# Patient Record
Sex: Female | Born: 1985 | Race: White | Hispanic: No | Marital: Single
Health system: Southern US, Community
[De-identification: ages and names within clinical notes are randomized; demographics above are authoritative.]

## PROBLEM LIST (undated history)

## (undated) DIAGNOSIS — F419 Anxiety disorder, unspecified: Secondary | ICD-10-CM

## (undated) DIAGNOSIS — B001 Herpesviral vesicular dermatitis: Secondary | ICD-10-CM

---

## 2020-01-25 ENCOUNTER — Other Ambulatory Visit: Payer: Self-pay | Admitting: Nurse Practitioner

## 2020-01-25 DIAGNOSIS — U071 COVID-19: Secondary | ICD-10-CM

## 2020-01-25 NOTE — Progress Notes (Signed)
I connected by phone with Yesenia Long on 01/25/2020 at 7:16 PM to discuss the potential use of a new treatment for mild to moderate COVID-19 viral infection in non-hospitalized patients.  This patient is a 34 y.o. female that meets the FDA criteria for Emergency Use Authorization of COVID monoclonal antibody casirivimab/imdevimab or bamlanivimab/eteseviamb.  Has a (+) direct SARS-CoV-2 viral test result  Has mild or moderate COVID-19   Is NOT hospitalized due to COVID-19  Is within 10 days of symptom onset  Has at least one of the high risk factor(s) for progression to severe COVID-19 and/or hospitalization as defined in EUA.  Specific high risk criteria : BMI > 25 and Other high risk medical condition per CDC:  smoker   I have spoken and communicated the following to the patient or parent/caregiver regarding COVID monoclonal antibody treatment:  1. FDA has authorized the emergency use for the treatment of mild to moderate COVID-19 in adults and pediatric patients with positive results of direct SARS-CoV-2 viral testing who are 29 years of age and older weighing at least 40 kg, and who are at high risk for progressing to severe COVID-19 and/or hospitalization.  2. The significant known and potential risks and benefits of COVID monoclonal antibody, and the extent to which such potential risks and benefits are unknown.  3. Information on available alternative treatments and the risks and benefits of those alternatives, including clinical trials.  4. Patients treated with COVID monoclonal antibody should continue to self-isolate and use infection control measures (e.g., wear mask, isolate, social distance, avoid sharing personal items, clean and disinfect "high touch" surfaces, and frequent handwashing) according to CDC guidelines.   5. The patient or parent/caregiver has the option to accept or refuse COVID monoclonal antibody treatment.  After reviewing this information with the  patient, the patient has agreed to receive one of the available covid 19 monoclonal antibodies and will be provided an appropriate fact sheet prior to infusion. [name], NP 01/25/2020 7:16 PM

## 2020-01-26 ENCOUNTER — Ambulatory Visit (HOSPITAL_COMMUNITY)
Admission: RE | Admit: 2020-01-26 | Discharge: 2020-01-26 | Disposition: A | Discharge status: Home / Self Care | Payer: 59 | Source: Ambulatory Visit | Attending: Pulmonary Disease | Admitting: Pulmonary Disease

## 2020-01-26 DIAGNOSIS — U071 COVID-19: Secondary | ICD-10-CM | POA: Insufficient documentation

## 2020-01-26 MED ORDER — FAMOTIDINE IN NACL 20-0.9 MG/50ML-% IV SOLN: 20.0000 mg | Freq: Once | INTRAVENOUS | Status: DC | PRN

## 2020-01-26 MED ORDER — SODIUM CHLORIDE 0.9 % IV SOLN: INTRAVENOUS | Status: DC | PRN

## 2020-01-26 MED ORDER — EPINEPHRINE 0.3 MG/0.3ML IJ SOAJ: 0.3000 mg | Freq: Once | INTRAMUSCULAR | Status: DC | PRN

## 2020-01-26 MED ORDER — SODIUM CHLORIDE 0.9 % IV SOLN: Freq: Once | INTRAVENOUS | Status: AC

## 2020-01-26 MED ORDER — METHYLPREDNISOLONE SODIUM SUCC 125 MG IJ SOLR: 125.0000 mg | Freq: Once | INTRAMUSCULAR | Status: DC | PRN

## 2020-01-26 MED ORDER — DIPHENHYDRAMINE HCL 50 MG/ML IJ SOLN: 50.0000 mg | Freq: Once | INTRAMUSCULAR | Status: DC | PRN

## 2020-01-26 MED ORDER — ALBUTEROL SULFATE HFA 108 (90 BASE) MCG/ACT IN AERS: 2.0000 | INHALATION_SPRAY | Freq: Once | RESPIRATORY_TRACT | Status: DC | PRN

## 2020-01-26 NOTE — Discharge Instructions (Signed)

## 2020-01-26 NOTE — Progress Notes (Signed)
  Diagnosis: COVID-19  Physician: Dr. Wright  Procedure: [facility] Med: bamlanivimab\etesevimab infusion - Provided patient with bamlanimivab\etesevimab fact sheet for patients, parents and caregivers prior to infusion.  Complications: No immediate complications noted.  Discharge: Discharged home   Larcenia Holaday J Xayla Puzio 01/26/2020  

## 2020-02-03 ENCOUNTER — Other Ambulatory Visit (HOSPITAL_COMMUNITY): Payer: Self-pay

## 2020-12-25 ENCOUNTER — Ambulatory Visit
Admission: RE | Admit: 2020-12-25 | Discharge: 2020-12-25 | Disposition: A | Discharge status: Home / Self Care | Payer: 59 | Source: Ambulatory Visit | Attending: Physician Assistant | Admitting: Physician Assistant

## 2020-12-25 ENCOUNTER — Other Ambulatory Visit: Payer: Self-pay | Admitting: Physician Assistant

## 2020-12-25 DIAGNOSIS — M5442 Lumbago with sciatica, left side: Secondary | ICD-10-CM

## 2020-12-25 DIAGNOSIS — G8929 Other chronic pain: Secondary | ICD-10-CM

## 2020-12-25 DIAGNOSIS — R202 Paresthesia of skin: Secondary | ICD-10-CM

## 2021-10-05 ENCOUNTER — Other Ambulatory Visit: Payer: Self-pay | Admitting: Internal Medicine

## 2021-10-05 ENCOUNTER — Ambulatory Visit
Admission: RE | Admit: 2021-10-05 | Discharge: 2021-10-05 | Disposition: A | Discharge status: Home / Self Care | Payer: 59 | Source: Ambulatory Visit | Attending: Internal Medicine | Admitting: Internal Medicine

## 2021-10-05 DIAGNOSIS — M25561 Pain in right knee: Secondary | ICD-10-CM

## 2021-10-17 ENCOUNTER — Encounter: Payer: Self-pay | Admitting: Orthopedic Surgery

## 2021-10-17 ENCOUNTER — Ambulatory Visit: Payer: 59 | Admitting: Surgical

## 2021-10-17 DIAGNOSIS — M25561 Pain in right knee: Secondary | ICD-10-CM

## 2021-10-17 DIAGNOSIS — G8929 Other chronic pain: Secondary | ICD-10-CM

## 2021-10-17 MED ORDER — LIDOCAINE HCL 1 % IJ SOLN
5.0000 mL | INTRAMUSCULAR | Status: AC | PRN
  Administered 2021-10-17: 5 mL

## 2021-10-17 MED ORDER — BUPIVACAINE HCL 0.25 % IJ SOLN
4.0000 mL | INTRAMUSCULAR | Status: AC | PRN
  Administered 2021-10-17: 4 mL via INTRA_ARTICULAR

## 2021-10-17 MED ORDER — METHYLPREDNISOLONE ACETATE 40 MG/ML IJ SUSP
40.0000 mg | INTRAMUSCULAR | Status: AC | PRN
  Administered 2021-10-17: 40 mg via INTRA_ARTICULAR
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
## 2021-10-18 LAB — VITAMIN D 25 HYDROXY (VIT D DEFICIENCY, FRACTURES): Vit D, 25-Hydroxy: 53 ng/mL (ref 30–100)

## 2021-11-14 ENCOUNTER — Ambulatory Visit (INDEPENDENT_AMBULATORY_CARE_PROVIDER_SITE_OTHER): Payer: 59 | Admitting: Surgical

## 2021-11-14 DIAGNOSIS — G8929 Other chronic pain: Secondary | ICD-10-CM

## 2021-11-14 DIAGNOSIS — M25561 Pain in right knee: Secondary | ICD-10-CM

## 2021-11-18 ENCOUNTER — Encounter: Payer: Self-pay | Admitting: Orthopedic Surgery

## 2021-11-18 NOTE — Progress Notes (Signed)
Office Visit Note   Patient: Yesenia Long           Date of Birth: April 07, 1986           MRN: [id number] Visit Date: 11/14/2021 Requested by: Collene Mares, PA [address] [place] [place],  [place] [address] PCP: Hyacinth Meeker, [place], [place]  Subjective: Chief Complaint  Patient presents with   Right Knee - Follow-up    HPI: Yesenia Long is a 36 y.o. female who presents to the office for follow-up following right knee injection on 10/17/2021.  She got about 60% relief from the injection.  She has stopped running and switch to elliptical.  She still notes increased pain with her leg hanging or crossing her legs.  Pain level depends on how active she is.  Pain is not waking her up at night.  She does have history of RA but does not have any rheumatologist right now she is working to get that set up.              ROS: All systems reviewed are negative as they relate to the chief complaint within the history of present illness.  Patient denies fevers or chills.  Assessment & Plan: Visit Diagnoses:  1. Chronic pain of right knee   2. Lateral joint line tenderness of knee, right     Plan:   Last visit (10/17/21): Patient is a 36 year old female who presents for evaluation of right knee pain.  She has increased pain over the last 5 weeks without any injury event that she can recall.  She has reduced her weight lifting and running without improvement of her symptoms.  Radiographs were ordered by her PCP that showed no significant degenerative changes or any acute fracture, dislocation, chondral defect that is apparent.  With lack of effusion today and her history of rheumatoid arthritis that may be contributing to her joint pain, plan to try cortisone injection today.  Also recommended she stop running and transition to stationary bike or elliptical workouts instead for cardio.  Checking her vitamin D today to see if this is low and needs to be supplemented as she may have some  contribution from a stress reaction in this right tibia.  Follow-up in 6 weeks for clinical recheck.  If no improvement at that time or if her symptoms continue get worse with no improvement from the injection, we will plan to set her up for MRI of the right knee for further evaluation.  Patient agreed with plan.  Tolerated injection well.  Today's visit:  She had 60% improvement from cortisone injection initially but this is wearing off.  Symptoms are still present despite her switching from running to elliptical.  She had vitamin D checked at last visit which was found to be 53.  No effusion today but continued joint line tenderness primarily over the lateral joint line.  With continued symptoms and lack of full relief from cortisone injection as well as history of RA, plan to order MRI of the right knee for further evaluation of potential meniscal injury.  Follow-up after MRI to review results with Dr. August Saucer.  Follow-Up Instructions: No follow-ups on file.   Orders:  Orders Placed This Encounter  Procedures   MR Knee Right w/o contrast   No orders of the defined types were placed in this encounter.     Procedures: No procedures performed   Clinical Data: No additional findings.  Objective: Vital Signs: There were no vitals taken  for this visit.  Physical Exam:  Constitutional: Patient appears well-developed HEENT:  Head: Normocephalic Eyes:EOM are normal Neck: Normal range of motion Cardiovascular: Normal rate Pulmonary/chest: Effort normal Neurologic: Patient is alert Skin: Skin is warm Psychiatric: Patient has normal mood and affect  Ortho Exam: Ortho exam demonstrates right knee with no effusion.  Continued lateral joint line tenderness moderately and mild medial joint line tenderness.  She is able to perform straight leg raise.  Excellent range of motion with 0 degrees extension and greater than 120 degrees knee flexion.  No reproduction of knee pain with hip range of  motion.  Stable to anterior posterior drawer sign.  No laxity to varus/valgus stress.  No tenderness over the pes anserine bursa, quad tendon, patellar tendon, patella.  Specialty Comments:  No specialty comments available.  Imaging: No results found.   PMFS History: There are no problems to display for this patient.  No past medical history on file.  No family history on file.  No past surgical history on file. Social History   Occupational History   Not on file  Tobacco Use   Smoking status: Not on file   Smokeless tobacco: Not on file  Substance and Sexual Activity   Alcohol use: Not on file   Drug use: Not on file   Sexual activity: Not on file

## 2021-11-25 ENCOUNTER — Other Ambulatory Visit: Payer: 59

## 2021-12-05 ENCOUNTER — Encounter: Payer: Self-pay | Admitting: Orthopedic Surgery

## 2021-12-05 ENCOUNTER — Ambulatory Visit (INDEPENDENT_AMBULATORY_CARE_PROVIDER_SITE_OTHER): Payer: 59 | Admitting: Orthopedic Surgery

## 2021-12-05 DIAGNOSIS — M25561 Pain in right knee: Secondary | ICD-10-CM | POA: Diagnosis not present

## 2021-12-05 DIAGNOSIS — G8929 Other chronic pain: Secondary | ICD-10-CM | POA: Diagnosis not present

## 2021-12-05 NOTE — Progress Notes (Signed)
Office Visit Note   Patient: Yesenia Long           Date of Birth: 05/03/1985           MRN: [id number] Visit Date: 12/05/2021 Requested by: Collene Mares, PA [address] [place],  [place] [address] PCP: Hyacinth Meeker, [place], [place]  Subjective: Chief Complaint  Patient presents with   Right Knee - Follow-up    HPI: Patient presents for follow-up of right knee pain.  Patient describes symptoms in the right knee of 8 months duration.  She has had to stop running.  Symptoms have been getting progressively worse.  Denies any other joint complaints.  Does have history of rheumatoid arthritis for which she receives hip injections but she denies any groin pain currently on either side.  Denies much in the way of back pain or radicular component.  She has had an injection in the right knee which helped.  She has been doing physician guided therapy at the gym 4 to 5 days a week consisting of leg extension elliptical and stationary bike.  She has been using a compression sleeve as well.  Also using Naprosyn and ice.  Vitamin D level returned normal at 53.              ROS: All systems reviewed are negative as they relate to the chief complaint within the history of present illness.  Patient denies  fevers or chills.   Assessment & Plan: Visit Diagnoses:  1. Chronic pain of right knee     Plan: Impression is anterolateral and retropatellar right knee pain of 8 months duration with failure of conservative treatment including medication injections as well as physician directed therapy program with quad strengthening.  Her quad strength and flexibility is excellent at this time but the pain remains.  She has failed an exhaustive course of nonoperative treatment.  Differential diagnosis includes anterior horn lateral meniscal pathology versus stress reaction versus occult arthritis.  No effusion in the knee today.  Plan MRI scan to evaluate with follow-up after that study.  Follow-Up  Instructions: Return for after MRI.   Orders:  No orders of the defined types were placed in this encounter.  No orders of the defined types were placed in this encounter.     Procedures: No procedures performed   Clinical Data: No additional findings.  Objective: Vital Signs: There were no vitals taken for this visit.  Physical Exam:   Constitutional: Patient appears well-developed HEENT:  Head: Normocephalic Eyes:EOM are normal Neck: Normal range of motion Cardiovascular: Normal rate Pulmonary/chest: Effort normal Neurologic: Patient is alert Skin: Skin is warm Psychiatric: Patient has normal mood and affect   Ortho Exam: Ortho exam demonstrates full active and passive range of motion of the right knee.  Quad strength is excellent.  Flexibility is excellent with quad and hamstring flexibility.  Does have retropatellar tenderness and Periretinacular tenderness on the right not the left.  No effusion is present.  Anterolateral joint line tenderness is present on the right not on the left.  No warmth in either knee.  Specialty Comments:  No specialty comments available.  Imaging: No results found.   PMFS History: There are no problems to display for this patient.  History reviewed. No pertinent past medical history.  History reviewed. No pertinent family history.  History reviewed. No pertinent surgical history. Social History   Occupational History   Not on file  Tobacco Use   Smoking status:  Not on file   Smokeless tobacco: Not on file  Substance and Sexual Activity   Alcohol use: Not on file   Drug use: Not on file   Sexual activity: Not on file

## 2021-12-05 NOTE — Addendum Note (Signed)
Addended byPrescott Parma on: 12/05/2021 01:43 PM   Modules accepted: Orders

## 2021-12-23 ENCOUNTER — Ambulatory Visit
Admission: RE | Admit: 2021-12-23 | Discharge: 2021-12-23 | Disposition: A | Discharge status: Home / Self Care | Payer: 59 | Source: Ambulatory Visit | Attending: Orthopedic Surgery | Admitting: Orthopedic Surgery

## 2021-12-23 DIAGNOSIS — G8929 Other chronic pain: Secondary | ICD-10-CM

## 2021-12-26 ENCOUNTER — Ambulatory Visit: Payer: 59 | Admitting: Orthopedic Surgery

## 2021-12-26 ENCOUNTER — Ambulatory Visit: Payer: Self-pay

## 2021-12-26 DIAGNOSIS — M7631 Iliotibial band syndrome, right leg: Secondary | ICD-10-CM | POA: Diagnosis not present

## 2021-12-26 DIAGNOSIS — M7632 Iliotibial band syndrome, left leg: Secondary | ICD-10-CM | POA: Diagnosis not present

## 2021-12-26 DIAGNOSIS — M25561 Pain in right knee: Secondary | ICD-10-CM | POA: Diagnosis not present

## 2021-12-29 ENCOUNTER — Encounter: Payer: Self-pay | Admitting: Orthopedic Surgery

## 2021-12-29 MED ORDER — METHYLPREDNISOLONE ACETATE 40 MG/ML IJ SUSP
20.0000 mg | INTRAMUSCULAR | Status: AC | PRN
  Administered 2021-12-26: 20 mg via INTRA_ARTICULAR

## 2021-12-29 MED ORDER — BUPIVACAINE HCL 0.25 % IJ SOLN
2.0000 mL | INTRAMUSCULAR | Status: AC | PRN
  Administered 2021-12-26: 2 mL via INTRA_ARTICULAR

## 2021-12-29 MED ORDER — LIDOCAINE HCL 1 % IJ SOLN
5.0000 mL | INTRAMUSCULAR | Status: AC | PRN
  Administered 2021-12-26: 5 mL

## 2021-12-29 NOTE — Progress Notes (Signed)
Office Visit Note   Patient: Yesenia Long           Date of Birth: February 06, 1986           MRN: [id number] Visit Date: 12/26/2021 Requested by: Scheryl Marten, [address],  [place] [address] PCP: Sabra Heck, [place], [place]  Subjective: Chief Complaint  Patient presents with   Right Knee - Follow-up   Other     Scan review    HPI: Yesenia Long is a 36 year old patient with right knee pain.  Since she was last seen she has had an MRI scan which is reviewed.  That scan shows mild edema signal between the distal iliotibial band and lateral femoral condyle with no evidence of meniscal tear or ligamentous injury.  Mild lateral compartment chondrosis is present with no focal chondral defect.  Patient states that she has decreased her activity.  Doing elliptical only and she quit running.  Intra-articular injection gave her about 60% relief.              ROS: All systems reviewed are negative as they relate to the chief complaint within the history of present illness.  Patient denies  fevers or chills.   Assessment & Plan: Visit Diagnoses:  1. Right knee pain, unspecified chronicity     Plan: Impression is iliotibial band bursitis right knee with no definite surgical indication.  Plan is ultrasound-guided injection between the iliotibial band and lateral femoral condyle.  Injection performed today with good extravasation of fluid in the appropriate location.  Follow-up with [place] in 8 weeks for clinical recheck.  Also showed her how to do some iliotibial band stretching exercises.  Follow-Up Instructions: No follow-ups on file.   Orders:  Orders Placed This Encounter  Procedures   US Guided Needle Placement - No Linked Charges   No orders of the defined types were placed in this encounter.     Procedures: Large Joint Inj: R knee on 12/26/2021 12:59 PM Indications: diagnostic evaluation, joint swelling and pain Details: 18 G 1.5 in needle, ultrasound-guided  superolateral approach  Arthrogram: No  Medications: 5 mL lidocaine 1 %; 20 mg methylPREDNISolone acetate 40 MG/ML; 2 mL bupivacaine 0.25 % Outcome: tolerated well, no immediate complications  Ultrasound injection utilized to inject Marcaine and Celestone between the lateral condyle and iliotibial band.  Successful injection confirmed under ultrasound guidance Procedure, treatment alternatives, risks and benefits explained, specific risks discussed. Consent was given by the patient. Immediately prior to procedure a time out was called to verify the correct patient, procedure, equipment, support staff and site/side marked as required. Patient was prepped and draped in the usual sterile fashion.       Clinical Data: No additional findings.  Objective: Vital Signs: There were no vitals taken for this visit.  Physical Exam:   Constitutional: Patient appears well-developed HEENT:  Head: Normocephalic Eyes:EOM are normal Neck: Normal range of motion Cardiovascular: Normal rate Pulmonary/chest: Effort normal Neurologic: Patient is alert Skin: Skin is warm Psychiatric: Patient has normal mood and affect   Ortho Exam: Ortho exam demonstrates normal gait and alignment.  No groin pain on the right with internal/external Tatian of the leg.  No nerve root tension signs.  Pedal pulses palpable.  No other masses lymphadenopathy or skin changes noted in the right knee region.  Collateral crucial ligaments are stable.  She does have some tenderness over the lateral condyle as well as lateral joint line.  Specialty Comments:  No  specialty comments available.  Imaging: No results found.   PMFS History: There are no problems to display for this patient.  No past medical history on file.  No family history on file.  No past surgical history on file. Social History   Occupational History   Not on file  Tobacco Use   Smoking status: Not on file   Smokeless tobacco: Not on file   Substance and Sexual Activity   Alcohol use: Not on file   Drug use: Not on file   Sexual activity: Not on file

## 2022-02-22 ENCOUNTER — Ambulatory Visit (INDEPENDENT_AMBULATORY_CARE_PROVIDER_SITE_OTHER): Payer: 59 | Admitting: Orthopedic Surgery

## 2022-02-22 ENCOUNTER — Encounter: Payer: Self-pay | Admitting: Orthopedic Surgery

## 2022-02-22 DIAGNOSIS — M7631 Iliotibial band syndrome, right leg: Secondary | ICD-10-CM | POA: Diagnosis not present

## 2022-02-22 DIAGNOSIS — M7632 Iliotibial band syndrome, left leg: Secondary | ICD-10-CM

## 2022-02-22 NOTE — Progress Notes (Signed)
Office Visit Note   Patient: Yesenia Long           Date of Birth: 10-01-1985           MRN: [id number] Visit Date: 02/22/2022 Requested by: Collene Mares, PA [address] [place],  [place] [address] PCP: Hyacinth Meeker, [place], [place]  Subjective: Chief Complaint  Patient presents with   Right Knee - Follow-up    HPI: Yesenia Long is a 36 y.o. female who presents to the office reporting right anterior lateral knee pain.  Injection helped some but the constant pain has improved.  She does report some difficulty with flexion activities.  She states that the knee will not bend fully.  Did give her some pain when she was walking around the Christmas show for 5 hours.  MRI scan is reviewed with the patient.  I think it is possible she may have a cyst on that lateral side which is down under the anterior lateral meniscus and potentially extending up around the popliteus region.  Reviewed on the scan with her particularly the axial and coronal views.  We did try to identify that with ultrasound last clinic visit without much success.              ROS: All systems reviewed are negative as they relate to the chief complaint within the history of present illness.  Patient denies fevers or chills.  Assessment & Plan: Visit Diagnoses: No diagnosis found.  Plan: Impression is right knee pain which may be related to a small cyst around that lateral meniscus.  Radiologist thought that this was more likely to be joint fluid but the globular nature both around the popliteus attachment site as well as under the meniscus makes me think that it could be cystic in nature.  We are going to see patient back as needed because her symptoms are not really bad enough for any type of surgical exploration.  We will consider MRI scan neck spring which would be 6 months after her other MRI in order to compare the 2 to see if there is been any change in that area in question.  Follow-Up Instructions: No  follow-ups on file.   Orders:  No orders of the defined types were placed in this encounter.  No orders of the defined types were placed in this encounter.     Procedures: No procedures performed   Clinical Data: No additional findings.  Objective: Vital Signs: There were no vitals taken for this visit.  Physical Exam:  Constitutional: Patient appears well-developed HEENT:  Head: Normocephalic Eyes:EOM are normal Neck: Normal range of motion Cardiovascular: Normal rate Pulmonary/chest: Effort normal Neurologic: Patient is alert Skin: Skin is warm Psychiatric: Patient has normal mood and affect  Ortho Exam: Ortho exam demonstrates full active and passive range of motion.  No effusion in the right knee.  Quadrant is excellent.  Collateral cruciate ligaments are stable.  Does have some anterolateral joint line tenderness along with tenderness of the fat pad on the lateral side.  Negative McMurray testing.  Pedal pulses palpable.  Specialty Comments:  No specialty comments available.  Imaging: No results found.   PMFS History: There are no problems to display for this patient.  History reviewed. No pertinent past medical history.  History reviewed. No pertinent family history.  History reviewed. No pertinent surgical history. Social History   Occupational History   Not on file  Tobacco Use   Smoking status: Not on  file   Smokeless tobacco: Not on file  Substance and Sexual Activity   Alcohol use: Not on file   Drug use: Not on file   Sexual activity: Not on file

## 2022-06-24 DIAGNOSIS — F4322 Adjustment disorder with anxiety: Secondary | ICD-10-CM | POA: Diagnosis not present

## 2022-06-24 DIAGNOSIS — F1121 Opioid dependence, in remission: Secondary | ICD-10-CM | POA: Diagnosis not present

## 2022-07-08 DIAGNOSIS — F4323 Adjustment disorder with mixed anxiety and depressed mood: Secondary | ICD-10-CM | POA: Diagnosis not present

## 2022-07-11 DIAGNOSIS — F4323 Adjustment disorder with mixed anxiety and depressed mood: Secondary | ICD-10-CM | POA: Diagnosis not present

## 2022-07-15 DIAGNOSIS — F4322 Adjustment disorder with anxiety: Secondary | ICD-10-CM | POA: Diagnosis not present

## 2022-07-17 DIAGNOSIS — F4323 Adjustment disorder with mixed anxiety and depressed mood: Secondary | ICD-10-CM | POA: Diagnosis not present

## 2022-07-24 DIAGNOSIS — F4323 Adjustment disorder with mixed anxiety and depressed mood: Secondary | ICD-10-CM | POA: Diagnosis not present

## 2022-07-31 DIAGNOSIS — S6991XA Unspecified injury of right wrist, hand and finger(s), initial encounter: Secondary | ICD-10-CM | POA: Diagnosis not present

## 2022-07-31 DIAGNOSIS — F4323 Adjustment disorder with mixed anxiety and depressed mood: Secondary | ICD-10-CM | POA: Diagnosis not present

## 2022-07-31 DIAGNOSIS — L089 Local infection of the skin and subcutaneous tissue, unspecified: Secondary | ICD-10-CM | POA: Diagnosis not present

## 2022-07-31 DIAGNOSIS — W5501XA Bitten by cat, initial encounter: Secondary | ICD-10-CM | POA: Diagnosis not present

## 2022-08-01 ENCOUNTER — Other Ambulatory Visit: Payer: Self-pay

## 2022-08-01 ENCOUNTER — Inpatient Hospital Stay (HOSPITAL_BASED_OUTPATIENT_CLINIC_OR_DEPARTMENT_OTHER)
Admission: EM | Admit: 2022-08-01 | Discharge: 2022-08-13 | DRG: 982 | Disposition: A | Discharge status: Home / Self Care | Payer: BC Managed Care – PPO | Attending: Internal Medicine | Admitting: Internal Medicine

## 2022-08-01 ENCOUNTER — Inpatient Hospital Stay (HOSPITAL_COMMUNITY): Payer: BC Managed Care – PPO

## 2022-08-01 ENCOUNTER — Encounter (HOSPITAL_BASED_OUTPATIENT_CLINIC_OR_DEPARTMENT_OTHER): Payer: Self-pay | Admitting: *Deleted

## 2022-08-01 DIAGNOSIS — Z791 Long term (current) use of non-steroidal anti-inflammatories (NSAID): Secondary | ICD-10-CM | POA: Diagnosis not present

## 2022-08-01 DIAGNOSIS — S61452A Open bite of left hand, initial encounter: Secondary | ICD-10-CM | POA: Diagnosis not present

## 2022-08-01 DIAGNOSIS — L03114 Cellulitis of left upper limb: Secondary | ICD-10-CM | POA: Diagnosis not present

## 2022-08-01 DIAGNOSIS — R61 Generalized hyperhidrosis: Secondary | ICD-10-CM | POA: Diagnosis not present

## 2022-08-01 DIAGNOSIS — L089 Local infection of the skin and subcutaneous tissue, unspecified: Secondary | ICD-10-CM

## 2022-08-01 DIAGNOSIS — W5501XD Bitten by cat, subsequent encounter: Secondary | ICD-10-CM | POA: Diagnosis not present

## 2022-08-01 DIAGNOSIS — Z2914 Encounter for prophylactic rabies immune globin: Secondary | ICD-10-CM | POA: Diagnosis not present

## 2022-08-01 DIAGNOSIS — T361X5A Adverse effect of cephalosporins and other beta-lactam antibiotics, initial encounter: Secondary | ICD-10-CM | POA: Diagnosis not present

## 2022-08-01 DIAGNOSIS — T373X5A Adverse effect of other antiprotozoal drugs, initial encounter: Secondary | ICD-10-CM | POA: Diagnosis not present

## 2022-08-01 DIAGNOSIS — L03012 Cellulitis of left finger: Secondary | ICD-10-CM | POA: Diagnosis present

## 2022-08-01 DIAGNOSIS — L03011 Cellulitis of right finger: Principal | ICD-10-CM | POA: Diagnosis present

## 2022-08-01 DIAGNOSIS — Z880 Allergy status to penicillin: Secondary | ICD-10-CM

## 2022-08-01 DIAGNOSIS — M7989 Other specified soft tissue disorders: Secondary | ICD-10-CM | POA: Diagnosis not present

## 2022-08-01 DIAGNOSIS — R197 Diarrhea, unspecified: Secondary | ICD-10-CM | POA: Diagnosis not present

## 2022-08-01 DIAGNOSIS — S61451A Open bite of right hand, initial encounter: Secondary | ICD-10-CM | POA: Diagnosis not present

## 2022-08-01 DIAGNOSIS — Z23 Encounter for immunization: Secondary | ICD-10-CM

## 2022-08-01 DIAGNOSIS — F419 Anxiety disorder, unspecified: Secondary | ICD-10-CM | POA: Diagnosis present

## 2022-08-01 DIAGNOSIS — S61254A Open bite of right ring finger without damage to nail, initial encounter: Secondary | ICD-10-CM | POA: Diagnosis not present

## 2022-08-01 DIAGNOSIS — S61259D Open bite of unspecified finger without damage to nail, subsequent encounter: Secondary | ICD-10-CM | POA: Diagnosis not present

## 2022-08-01 DIAGNOSIS — A281 Cat-scratch disease: Secondary | ICD-10-CM | POA: Diagnosis present

## 2022-08-01 DIAGNOSIS — Z203 Contact with and (suspected) exposure to rabies: Secondary | ICD-10-CM | POA: Diagnosis not present

## 2022-08-01 DIAGNOSIS — Z79899 Other long term (current) drug therapy: Secondary | ICD-10-CM | POA: Diagnosis not present

## 2022-08-01 DIAGNOSIS — S61252A Open bite of right middle finger without damage to nail, initial encounter: Secondary | ICD-10-CM | POA: Diagnosis not present

## 2022-08-01 DIAGNOSIS — S61452D Open bite of left hand, subsequent encounter: Secondary | ICD-10-CM | POA: Diagnosis not present

## 2022-08-01 DIAGNOSIS — L03113 Cellulitis of right upper limb: Secondary | ICD-10-CM | POA: Diagnosis not present

## 2022-08-01 DIAGNOSIS — R202 Paresthesia of skin: Secondary | ICD-10-CM | POA: Diagnosis not present

## 2022-08-01 DIAGNOSIS — S61259A Open bite of unspecified finger without damage to nail, initial encounter: Secondary | ICD-10-CM | POA: Diagnosis not present

## 2022-08-01 DIAGNOSIS — L0390 Cellulitis, unspecified: Secondary | ICD-10-CM | POA: Diagnosis not present

## 2022-08-01 DIAGNOSIS — W5501XA Bitten by cat, initial encounter: Secondary | ICD-10-CM | POA: Diagnosis not present

## 2022-08-01 DIAGNOSIS — M009 Pyogenic arthritis, unspecified: Secondary | ICD-10-CM | POA: Diagnosis not present

## 2022-08-01 DIAGNOSIS — T8140XA Infection following a procedure, unspecified, initial encounter: Secondary | ICD-10-CM | POA: Diagnosis not present

## 2022-08-01 DIAGNOSIS — L03119 Cellulitis of unspecified part of limb: Principal | ICD-10-CM

## 2022-08-01 DIAGNOSIS — R600 Localized edema: Secondary | ICD-10-CM | POA: Diagnosis not present

## 2022-08-01 HISTORY — DX: Anxiety disorder, unspecified: F41.9

## 2022-08-01 HISTORY — DX: Herpesviral vesicular dermatitis: B00.1

## 2022-08-01 LAB — CBC WITH DIFFERENTIAL/PLATELET
Abs Immature Granulocytes: 0.03 10*3/uL (ref 0.00–0.07)
Basophils Absolute: 0 10*3/uL (ref 0.0–0.1)
Basophils Relative: 0 %
Eosinophils Absolute: 0 10*3/uL (ref 0.0–0.5)
Eosinophils Relative: 0 %
HCT: 42.7 % (ref 36.0–46.0)
Hemoglobin: 14.7 g/dL (ref 12.0–15.0)
Immature Granulocytes: 0 %
Lymphocytes Relative: 15 %
Lymphs Abs: 1.5 10*3/uL (ref 0.7–4.0)
MCH: 31.6 pg (ref 26.0–34.0)
MCHC: 34.4 g/dL (ref 30.0–36.0)
MCV: 91.8 fL (ref 80.0–100.0)
Monocytes Absolute: 0.7 10*3/uL (ref 0.1–1.0)
Monocytes Relative: 7 %
Neutro Abs: 7.4 10*3/uL (ref 1.7–7.7)
Neutrophils Relative %: 78 %
Platelets: 249 10*3/uL (ref 150–400)
RBC: 4.65 MIL/uL (ref 3.87–5.11)
RDW: 12.6 % (ref 11.5–15.5)
WBC: 9.6 10*3/uL (ref 4.0–10.5)
nRBC: 0 % (ref 0.0–0.2)

## 2022-08-01 LAB — COMPREHENSIVE METABOLIC PANEL
ALT: 17 U/L (ref 0–44)
AST: 27 U/L (ref 15–41)
Albumin: 4.7 g/dL (ref 3.5–5.0)
Alkaline Phosphatase: 45 U/L (ref 38–126)
Anion gap: 7 (ref 5–15)
BUN: 12 mg/dL (ref 6–20)
CO2: 25 mmol/L (ref 22–32)
Calcium: 9.3 mg/dL (ref 8.9–10.3)
Chloride: 104 mmol/L (ref 98–111)
Creatinine, Ser: 0.63 mg/dL (ref 0.44–1.00)
GFR, Estimated: >60 mL/min (ref >60)
Glucose, Bld: 82 mg/dL (ref 70–99)
Potassium: 4.9 mmol/L (ref 3.5–5.1)
Sodium: 136 mmol/L (ref 135–145)
Total Bilirubin: 0.5 mg/dL (ref 0.3–1.2)
Total Protein: 7.2 g/dL (ref 6.5–8.1)

## 2022-08-01 LAB — PREGNANCY, URINE: Preg Test, Ur: NEGATIVE

## 2022-08-01 MED ORDER — RABIES VACCINE, PCEC IM SUSR
1.0000 mL | Freq: Once | INTRAMUSCULAR | Status: AC
  Administered 2022-08-01: 1 mL via INTRAMUSCULAR
  Filled 2022-08-01: qty 1

## 2022-08-01 MED ORDER — SODIUM CHLORIDE 0.9 % IV SOLN
1.0000 g | Freq: Once | INTRAVENOUS | Status: AC
  Administered 2022-08-01: 1 g via INTRAVENOUS
  Filled 2022-08-01: qty 10

## 2022-08-01 MED ORDER — CLINDAMYCIN PHOSPHATE 600 MG/50ML IV SOLN: 600.0000 mg | Freq: Once | INTRAVENOUS | Status: DC

## 2022-08-01 MED ORDER — OXYCODONE-ACETAMINOPHEN 5-325 MG PO TABS
1.0000 | ORAL_TABLET | Freq: Once | ORAL | Status: AC
  Administered 2022-08-01: 1 via ORAL
  Filled 2022-08-01: qty 1

## 2022-08-01 MED ORDER — ONDANSETRON HCL 4 MG PO TABS: 4.0000 mg | ORAL_TABLET | Freq: Four times a day (QID) | ORAL | Status: DC | PRN

## 2022-08-01 MED ORDER — ENOXAPARIN SODIUM 40 MG/0.4ML IJ SOSY
40.0000 mg | PREFILLED_SYRINGE | INTRAMUSCULAR | Status: DC
  Administered 2022-08-02 – 2022-08-12 (×11): 40 mg via SUBCUTANEOUS
  Filled 2022-08-01 (×11): qty 0.4

## 2022-08-01 MED ORDER — ACETAMINOPHEN 650 MG RE SUPP: 650.0000 mg | Freq: Four times a day (QID) | RECTAL | Status: DC | PRN

## 2022-08-01 MED ORDER — AZITHROMYCIN 250 MG PO TABS
500.0000 mg | ORAL_TABLET | Freq: Every day | ORAL | Status: AC
  Administered 2022-08-02: 500 mg via ORAL
  Filled 2022-08-01: qty 2

## 2022-08-01 MED ORDER — ONDANSETRON HCL 4 MG/2ML IJ SOLN: 4.0000 mg | Freq: Four times a day (QID) | INTRAMUSCULAR | Status: DC | PRN

## 2022-08-01 MED ORDER — ESCITALOPRAM OXALATE 10 MG PO TABS
10.0000 mg | ORAL_TABLET | Freq: Every day | ORAL | Status: DC
  Administered 2022-08-02 – 2022-08-13 (×12): 10 mg via ORAL
  Filled 2022-08-01 (×12): qty 1

## 2022-08-01 MED ORDER — FENTANYL CITRATE PF 50 MCG/ML IJ SOSY
50.0000 ug | PREFILLED_SYRINGE | Freq: Once | INTRAMUSCULAR | Status: AC
  Administered 2022-08-01: 50 ug via INTRAVENOUS
  Filled 2022-08-01: qty 1

## 2022-08-01 MED ORDER — SENNOSIDES-DOCUSATE SODIUM 8.6-50 MG PO TABS: 1.0000 | ORAL_TABLET | Freq: Every evening | ORAL | Status: DC | PRN

## 2022-08-01 MED ORDER — RABIES IMMUNE GLOBULIN 150 UNIT/ML IM INJ
20.0000 [IU]/kg | INJECTION | Freq: Once | INTRAMUSCULAR | Status: AC
  Administered 2022-08-01: 1650 [IU] via INTRAMUSCULAR
  Filled 2022-08-01: qty 12

## 2022-08-01 MED ORDER — GADOBUTROL 1 MMOL/ML IV SOLN
8.0000 mL | Freq: Once | INTRAVENOUS | Status: AC | PRN
  Administered 2022-08-01: 8 mL via INTRAVENOUS

## 2022-08-01 MED ORDER — HYDROCODONE-ACETAMINOPHEN 5-325 MG PO TABS
1.0000 | ORAL_TABLET | ORAL | Status: DC | PRN
  Administered 2022-08-02 – 2022-08-03 (×4): 2 via ORAL
  Administered 2022-08-03: 1 via ORAL
  Administered 2022-08-03 – 2022-08-04 (×3): 2 via ORAL
  Administered 2022-08-05 (×2): 1 via ORAL
  Administered 2022-08-05 – 2022-08-06 (×6): 2 via ORAL
  Filled 2022-08-01 (×4): qty 2
  Filled 2022-08-01: qty 1
  Filled 2022-08-01 (×9): qty 2
  Filled 2022-08-01 (×2): qty 1
  Filled 2022-08-01: qty 2

## 2022-08-01 MED ORDER — ACETAMINOPHEN 325 MG PO TABS
650.0000 mg | ORAL_TABLET | Freq: Four times a day (QID) | ORAL | Status: DC | PRN
  Administered 2022-08-06 – 2022-08-09 (×4): 650 mg via ORAL
  Filled 2022-08-01 (×4): qty 2

## 2022-08-01 MED ORDER — SODIUM CHLORIDE 0.9 % IV SOLN
2.0000 g | INTRAVENOUS | Status: DC
  Administered 2022-08-02 – 2022-08-08 (×7): 2 g via INTRAVENOUS
  Filled 2022-08-01 (×7): qty 20

## 2022-08-01 MED ORDER — METRONIDAZOLE 500 MG/100ML IV SOLN
500.0000 mg | Freq: Two times a day (BID) | INTRAVENOUS | Status: DC
  Administered 2022-08-01 – 2022-08-08 (×13): 500 mg via INTRAVENOUS
  Filled 2022-08-01 (×14): qty 100

## 2022-08-01 MED ORDER — AZITHROMYCIN 250 MG PO TABS
250.0000 mg | ORAL_TABLET | Freq: Every day | ORAL | Status: AC
  Administered 2022-08-03 – 2022-08-06 (×4): 250 mg via ORAL
  Filled 2022-08-01 (×4): qty 1

## 2022-08-01 MED ORDER — LIDOCAINE HCL 2 % IJ SOLN
10.0000 mL | Freq: Once | INTRAMUSCULAR | Status: AC
  Administered 2022-08-02: 200 mg via INTRADERMAL
  Filled 2022-08-01: qty 20

## 2022-08-01 MED ORDER — METRONIDAZOLE 500 MG/100ML IV SOLN
500.0000 mg | Freq: Once | INTRAVENOUS | Status: AC
  Administered 2022-08-01: 500 mg via INTRAVENOUS
  Filled 2022-08-01: qty 100

## 2022-08-01 MED ORDER — MORPHINE SULFATE (PF) 2 MG/ML IV SOLN
2.0000 mg | INTRAVENOUS | Status: AC | PRN
  Administered 2022-08-01 – 2022-08-02 (×3): 2 mg via INTRAVENOUS
  Filled 2022-08-01 (×3): qty 1

## 2022-08-01 NOTE — ED Triage Notes (Signed)
Pt was bitten by her cat yesterday am, cat was not up to date on rabies vaccine.  Pt was seen at [facility] yesterday and placed on antibiotics.  Pt feels that she has swelling to hands (pt has had 2 doses of amoxicillin).

## 2022-08-01 NOTE — ED Notes (Signed)
[facility] at bedside to transport pt to [place] 

## 2022-08-01 NOTE — ED Notes (Signed)
Called [facility] -- informed that pt bed assignment is ready

## 2022-08-01 NOTE — Hospital Course (Signed)
Yesenia Long is a 37 y.o. female with medical history significant for anxiety who is admitted with infection of bilateral hands due to multiple cat bite wounds.

## 2022-08-01 NOTE — H&P (Addendum)
History and Physical    Yesenia Long ZOX:[id number] DOB: 05/19/1985 DOA: 08/01/2022  PCP: Collene Mares, PA  Patient coming from: Home  I have personally briefly reviewed patient's old medical records in [facility]  Chief Complaint: Cat bite  HPI: Yesenia Long is a 37 y.o. female with medical history significant for anxiety who presented to the ED for evaluation of cat bite wounds.  Patient states that she has an indoor cat which got loose and was missing outdoors for 8 days.  Yesterday her cat returned to the house and when patient tried to bring her cat in the home and the cat responded aggressively.  Patient suffered cat bites to multiple fingers of both hands as well as her left shoulder.  Patient states cat was previously vaccinated but no longer up-to-date.  Her cat is currently on 10-day quarantine.  Patient went to urgent care yesterday and was prescribed Augmentin.  She has taken 2 doses of the medication so far.  Today she noticed increased swelling and erythema of both of her hands with diminished range of motion in her fingers.  She noticed red streaking of her left arm tracking from her hand up her arm.  She has not had any fevers, chills, diaphoresis.  [facility] ED Course  Labs/Imaging on admission: I have personally reviewed following labs and imaging studies.  Initial vitals showed BP 125/80, pulse 89, RR 18, temp 98.4 F, SpO2 100% on room air.  Labs show WBC 9.6, hemoglobin 14.7, platelets 249,000, sodium 136, potassium 4.9, bicarb 25, BUN 12, creatinine 0.63, serum glucose 82, LFTs within normal limits.  Blood cultures in process.  Urine pregnancy negative.  Patient was given IV ceftriaxone and Flagyl.  She was also given rabies immunoglobulin and vaccine.  The hospitalist service was consulted to admit for further evaluation and management.  Review of Systems: All systems reviewed and are negative except as documented in history of present  illness above.   Past Medical History:  Diagnosis Date   Anxiety    Fever blister     History reviewed. No pertinent surgical history.  Social History:  reports that she does not drink alcohol and does not use drugs. No history on file for tobacco use.  Allergies  Allergen Reactions   Penicillins Rash    Patient states "all cillins"    History reviewed. No pertinent family history.   Prior to Admission medications   Medication Sig Start Date End Date Taking? Authorizing Provider  amoxicillin-clavulanate (AUGMENTIN) 875-125 MG tablet Take 1 tablet by mouth 2 (two) times daily. 07/31/22  Yes [provider]  escitalopram (LEXAPRO) 10 MG tablet Take 10 mg by mouth daily. 07/06/22  Yes [provider]  naproxen (NAPROSYN) 500 MG tablet Take 500 mg by mouth 2 (two) times daily.   Yes [provider]    Physical Exam: Vitals:   08/01/22 1630 08/01/22 1700 08/01/22 1715 08/01/22 1812  BP: 100/65 94/83 111/70 122/74  Pulse: 87 81 85 81  Resp:    18  Temp:    98 F (36.7 C)  TempSrc:    Oral  SpO2: 100% 100% 100% 98%  Weight:      Height:       Constitutional: Sitting up in bed, NAD, calm, comfortable Eyes: EOMI, lids and conjunctivae normal ENMT: Mucous membranes are moist. Posterior pharynx clear of any exudate or lesions.Normal dentition.  Neck: normal, supple, no masses. Respiratory: clear to auscultation bilaterally, no wheezing, no crackles.  Normal respiratory effort. No accessory muscle use.  Cardiovascular: Regular rate and rhythm, no murmurs / rubs / gallops. No extremity edema. 2+ pedal pulses. Abdomen: no tenderness, no masses palpated.  Musculoskeletal: Swelling of bilateral hands with diminished range of motion of the fingers with inability to make a fist bilaterally Skin: Multiple cat bite puncture wounds fingers of both hands as pictured below with associated swelling and erythema on the dorsal aspects of the hands and streaking  proximally up the left arm Neurologic: Sensation intact.  Grip strength diminished bilateral hands otherwise 5/5 in all 4.  Psychiatric: Normal judgment and insight. Alert and oriented x 3. Normal mood.           EKG: Not performed.  Assessment/Plan Principal Problem:   Cat bite of multiple sites of left hand and fingers with infection Active Problems:   Cat bite of right hand including fingers with infection   Yesenia Long is a 37 y.o. female with medical history significant for anxiety who is admitted with infection of bilateral hands due to multiple cat bite wounds.  Assessment and Plan: Cat bite of multiple sites of both hands with infection and cellulitis: Progressive erythema and swelling of bilateral hands with diminished ROM of the fingers within 24 hours of cat bite.  She is unable to make a fist with either hand at time of admission.  Has streaking up the left arm.  She is currently hemodynamically stable.  Case discussed with on-call hand surgery, Dr. Kerry Fort.  Recommendation is to obtain MRI of both hands as soon as able.  He will evaluate the patient tonight. -Hand surgery to consult, appreciate assistance -Continue IV ceftriaxone and Flagyl (reported penicillin allergy, tolerating cephalosporins) -Lower suspicion for cat scratch disease per hand surgery but they are recommending coverage at this point -start oral azithromycin -Obtain MRI left and right hand w/wo -S/p rabies vaccine and immune globulin 4/25   DVT prophylaxis: enoxaparin (LOVENOX) injection 40 mg Start: 08/02/22 2200 Code Status: Full code Family Communication: Spouse at bedside Disposition Plan: From home, dispo pending clinical progress Consults called: Hand surgery, Dr. Kerry Fort Severity of Illness: The appropriate patient status for this patient is INPATIENT. Inpatient status is judged to be reasonable and necessary in order to provide the required intensity of service to ensure the  patient's safety. The patient's presenting symptoms, physical exam findings, and initial radiographic and laboratory data in the context of their chronic comorbidities is felt to place them at high risk for further clinical deterioration. Furthermore, it is not anticipated that the patient will be medically stable for discharge from the hospital within 2 midnights of admission.   * I certify that at the point of admission it is my clinical judgment that the patient will require inpatient hospital care spanning beyond 2 midnights from the point of admission due to high intensity of service, high risk for further deterioration and high frequency of surveillance required.Darreld Mclean MD [facility]  If 7PM-7AM, please contact night-coverage [email or web address]  08/01/2022, 8:03 PM

## 2022-08-01 NOTE — Plan of Care (Signed)
Plan of Care Note for Accepted Transfer   Patient: Yesenia Long    ONG:[id number]     Facility requesting transfer: [facility] Requesting Provider: Patrici Ranks  37 year old female with a PMH of anxiety, presents to the ED with a chief complaint of cat bite which occurred yesterday. Started Augmentin yesterday but has worse hand swelling and red streaking. Admission requested for IV abx and ortho hand surgery consultation. ER provider spoke with orthopedics who will see patient in consultation.  Please re-page Ortho on call when patient arrives.  Most recent vitals, labs and radiology:  Blood pressure 111/70, pulse 85, temperature 98.2 F (36.8 C), temperature source Oral, resp. rate 18, height  (1.702 m), weight 82.6 kg, SpO2 100 %.      Component Value Date/Time   WBC 9.6 08/01/2022 1426   RBC 4.65 08/01/2022 1426   HGB 14.7 08/01/2022 1426   HCT 42.7 08/01/2022 1426   PLT 249 08/01/2022 1426   MCV 91.8 08/01/2022 1426   MCH 31.6 08/01/2022 1426   MCHC 34.4 08/01/2022 1426   RDW 12.6 08/01/2022 1426   LYMPHSABS 1.5 08/01/2022 1426   MONOABS 0.7 08/01/2022 1426   EOSABS 0.0 08/01/2022 1426   BASOSABS 0.0 08/01/2022 1426      Latest Ref Rng & Units 08/01/2022    2:26 PM  BMP  Glucose 70 - 99 mg/dL 82   BUN 6 - 20 mg/dL 12   Creatinine 4.40 - 1.00 mg/dL 1.02   Sodium 725 - 366 mmol/L 136   Potassium 3.5 - 5.1 mmol/L 4.9   Chloride 98 - 111 mmol/L 104   CO2 22 - 32 mmol/L 25   Calcium 8.9 - 10.3 mg/dL 9.3       Component Value Date/Time   NA 136 08/01/2022 1426   K 4.9 08/01/2022 1426   CL 104 08/01/2022 1426   CO2 25 08/01/2022 1426   GLUCOSE 82 08/01/2022 1426   BUN 12 08/01/2022 1426   CREATININE 0.63 08/01/2022 1426   CALCIUM 9.3 08/01/2022 1426   PROT 7.2 08/01/2022 1426   ALBUMIN 4.7 08/01/2022 1426   AST 27 08/01/2022 1426   ALT 17 08/01/2022 1426   ALKPHOS 45 08/01/2022 1426   BILITOT 0.5 08/01/2022 1426   GFRNONAA >60 08/01/2022 1426    No results  found.  The patient has been accepted for transfer to [facility] or [facility], depending on bed and resource availability. The patient will remain under the care and responsibility of the referring provider until they have arrived to our inpatient facility.  Author: Jonathan Kirkendoll Sharlette Dense, MD  08/01/2022  Check [email or web address] for on-call coverage.  Nursing staff, Please call [facility] Admits & Consults System-Wide number on Amion as soon as patient's arrival, so appropriate admitting provider can evaluate the pt.

## 2022-08-01 NOTE — ED Provider Notes (Addendum)
[facility] EMERGENCY DEPARTMENT AT [facility] Provider Note   CSN: [id number] Arrival date & time: 08/01/22  1055     History  Chief Complaint  Patient presents with   Animal Bite    Yesenia Long is a 37 y.o. female.  37 year old female with a PMH of anxiety, presents to the ED with a chief complaint of cat bite which occurred yesterday.  Evaluated at [facility] urgent care, placed on antibiotics such as Augmentin had her Tdap vaccine updated.  Today she reports that she feels her hands are now swollen, she has had 2 doses of her Augmentin him without any improvement in symptoms.  She is now noticing streaking in the skin extending from the digits all the way up her arm.  She reports that the wounds were oozing, however she bandaged these prior to going to work.  Her cat is not up-to-date with all vaccines and animal control has not been notified.  No fevers, no systemic signs.  The history is provided by the patient.  Animal Bite Associated symptoms: no fever        Home Medications Prior to Admission medications   Medication Sig Start Date End Date Taking? Authorizing Provider  amoxicillin-clavulanate (AUGMENTIN) 875-125 MG tablet Take 1 tablet by mouth 2 (two) times daily. 07/31/22  Yes [provider]  escitalopram (LEXAPRO) 10 MG tablet Take 10 mg by mouth daily. 07/06/22  Yes [provider]  naproxen (NAPROSYN) 500 MG tablet Take 500 mg by mouth 2 (two) times daily.   Yes [provider]      Allergies    Penicillins    Review of Systems   Review of Systems  Constitutional:  Negative for fever.  Skin:  Positive for wound.    Physical Exam Updated Vital Signs BP 111/69   Pulse 74   Temp 98.2 F (36.8 C) (Oral)   Resp 18   Ht  (1.702 m)   Wt 82.6 kg   SpO2 100%   BMI 28.51 kg/m  Physical Exam Vitals and nursing note reviewed.  Constitutional:      Appearance: Normal appearance.  HENT:     Head: Normocephalic and  atraumatic.     Mouth/Throat:     Mouth: Mucous membranes are moist.  Cardiovascular:     Rate and Rhythm: Normal rate.  Pulmonary:     Effort: Pulmonary effort is normal.  Abdominal:     General: Abdomen is flat.  Musculoskeletal:     Cervical back: Normal range of motion and neck supple.  Skin:    General: Skin is warm and dry.     Findings: Erythema present.          Comments: Bilateral hands with multiple bites to the digits, swelling noted but has good flexion.  Pulses are 2+ radial equal.  Streaking tracking up her arm with also visible wound to the left shoulder. Please see photos attached.   Neurological:     Mental Status: She is alert and oriented to person, place, and time.            ED Results / Procedures / Treatments   Labs (all labs ordered are listed, but only abnormal results are displayed) Labs Reviewed  CULTURE, BLOOD (ROUTINE X 2)  CULTURE, BLOOD (ROUTINE X 2)  CBC WITH DIFFERENTIAL/PLATELET  COMPREHENSIVE METABOLIC PANEL  PREGNANCY, URINE    EKG None  Radiology No results found.  Procedures Procedures    Medications Ordered in ED Medications  metroNIDAZOLE (FLAGYL) IVPB 500 mg (500 mg Intravenous New Bag/Given 08/01/22 1621)  rabies immune globulin (HYPERRAB/KEDRAB) injection 1,650 Units (1,650 Units Intramuscular Given 08/01/22 1328)  rabies vaccine (RABAVERT) injection 1 mL (1 mL Intramuscular Given 08/01/22 1338)  oxyCODONE-acetaminophen (PERCOCET/ROXICET) 5-325 MG per tablet 1 tablet (1 tablet Oral Given 08/01/22 1322)  cefTRIAXone (ROCEPHIN) 1 g in sodium chloride 0.9 % 100 mL IVPB (0 g Intravenous Stopped 08/01/22 1621)    ED Course/ Medical Decision Making/ A&P                             Medical Decision Making Amount and/or Complexity of Data Reviewed Labs: ordered.  Risk Prescription drug management. Decision regarding hospitalization.    Patient here status post cat bite which occurred yesterday, reports swelling to  bilateral hands, streaking in the skin along with tracking up her arm.  Did get started on Augmentin yesterday has taken 2 doses of COVID without any improvement in her symptoms.  Reports that the streaking is likely getting worse, now she feels like her hands have a hard time ranging.  She is here because her cat is not up-to-date with his vaccines, and she is seeking rabies treatment.  This patient presents to the ED for concern of cellulitis, this involves a number of treatment options, and is a complaint that carries with it a high risk of complications and morbidity.  The differential diagnosis includes worsen infection versus cellulitis.    Co morbidities: Discussed in HPI   Brief History:  See HPI.   EMR reviewed including pt PMHx, past surgical history and past visits to ER.   See HPI for more details   Lab Tests:  I ordered and independently interpreted labs.  The pertinent results include:    I personally reviewed all laboratory work and imaging. Metabolic panel without any acute abnormality specifically kidney function within normal limits and no significant electrolyte abnormalities. CBC without leukocytosis or significant anemia.   Imaging Studies:  No imaging studies ordered for this patient  Medicines ordered:  I ordered medication including rocephin, flagyl  for infection treatment Reevaluation of the patient after these medicines showed that the patient improved I have reviewed the patients home medicines and have made adjustments as needed  Consults:  I requested consultation with Orthopedics Charma Igo PA  and discussed lab and imaging findings as well as pertinent plan - they recommend: letting ortho know once patient arrives to the hospital.   Reevaluation:  After the interventions noted above I re-evaluated patient and found that they have :improved   Social Determinants of Health:  The patient's social determinants of health were a factor in  the care of this patient  Problem List / ED Course:  Patient presents to the ED after cat bite that occurred yesterday, evaluated by [facility] urgent care and was placed on Alimentum, did take 2 doses of this however streaking has not tracked up her arms.  She does have tetanus up-to-date.  She is here requesting rabies vaccines.  However now streaking has worsened and wounds appear to be worse she is having difficulty ranging her hands with hand flexion and extension.  The wounds were covered by her after they were disinfected yesterday.  She is afebrile, no white blood cell count, but wounds do appear to have worsened overnight.  I discussed this case with my attending Dr. Rosalia Hammers, we do feel the patient has likely failed outpatient treatment and  would have poor outcome with continue oral antibiotics.  I do feel patient meets criteria for admission at this time.  She has been started on Rocephin, given also some Flagyl and percocet for pain control. Call placed for hospitalist service.  Spoke to hospitalist service who will admit patient for further management.   Dispostion:  After consideration of the diagnostic results and the patients response to treatment, I feel that the patent would benefit from admission.   Portions of this note were generated with Scientist, clinical (histocompatibility and immunogenetics). Dictation errors may occur despite best attempts at proofreading.   Final Clinical Impression(s) / ED Diagnoses Final diagnoses:  Cellulitis of upper extremity, unspecified laterality    Rx / DC Orders ED Discharge Orders     None         Claude Manges, PA-C 08/01/22 1626    Margarita Grizzle, MD 08/01/22 1650    Claude Manges, PA-C 08/01/22 1650    Margarita Grizzle, MD 08/01/22 [phone]

## 2022-08-01 NOTE — Consult Note (Addendum)
Orthopaedic Surgery Hand and Upper Extremity History and Physical Examination 08/01/2022  Referring Provider: No referring provider defined for this encounter.  CC: Cat bites to bilateral hands  HPI: Yesenia Long is a 37 y.o. female who presented to the [facility] earlier today due to redness and swelling of both hands after sustaining multiple cat bites yesterday morning.  She reports she heard her dogs barking and found them cornering her indoor cat that had been lost for the last week or so.  She went to grab the cat and the cat started biting her.  Because the dogs were trying to get the cat, she did not let go of the cat.  She was bit multiple times on both hands as well as the left shoulder and neck.  She was given augmentin yesterday but this morning apparently had increased swelling and streaking extending up the arms.  She denies any numbness or tingling.     Past Medical History: Past Medical History:  Diagnosis Date   Anxiety    Fever blister      Medications: Scheduled Meds:  [START ON 08/02/2022] enoxaparin (LOVENOX) injection  40 mg Subcutaneous Q24H   [START ON 08/02/2022] escitalopram  10 mg Oral Daily   Continuous Infusions:  [START ON 08/02/2022] cefTRIAXone (ROCEPHIN)  IV     metronidazole 500 mg (08/01/22 2011)   PRN Meds:.acetaminophen **OR** acetaminophen, HYDROcodone-acetaminophen, morphine injection, ondansetron **OR** ondansetron (ZOFRAN) IV, senna-docusate  Allergies: Allergies as of 08/01/2022 - Review Complete 08/01/2022  Allergen Reaction Noted   Penicillins Rash 01/26/2020    Past Surgical History: History reviewed. No pertinent surgical history.   Social History: Social History   Occupational History   Not on file  Tobacco Use   Smoking status: Unknown   Smokeless tobacco: Not on file  Substance and Sexual Activity   Alcohol use: Never   Drug use: Never   Sexual activity: Not on file     Family History: History reviewed. No  pertinent family history. Otherwise, no relevant orthopaedic family history  ROS: Review of Systems: All systems reviewed and are negative except that mentioned in HPI  Work/Sport/Hobbies: See HPI  Physical Examination: Vitals:   08/01/22 1812 08/01/22 2213  BP: 122/74 111/68  Pulse: 81 86  Resp: 18 18  Temp: 98 F (36.7 C) 98.2 F (36.8 C)  SpO2: 98% 97%   Constitutional: Awake, alert.  WN/WD Appearance: healthy, no acute distress, well-groomed Affect: Normal HEENT: EOMI, mucous membranes moist CV: RRR Pulm: breathing comfortably   Bilateral Upper Extremity / Hand Multiple bites on both hands.  Erythema along dorsal fingers and extending proximally along the dorsal hand by approximately 2-3 cm.  The right ring finger has 2 bite marks distal to the PIP with one radial and one dorsal and 2 bite marks proximal to the PIP, again with one radial and one dorsal.  Based on the location of each of the bites, it does not seem likely that they entered the joint.  She is very tender to palpation on the radial aspect of the ring finger along the PIP joint and just proximal and distal to it.  She is significantly less tender on the volar and ulnar aspects of the PIP joint compared the the radial aspect.  No palpable fluctuance in either hand.  Nontender along volar aspects of digits.  She has pain with ROM of the right ring finger. Approximately 45 deg of flexion/extension of MCP joint and PIP joint.  Other digits are  less painful and have better ROM.  There is slight serosanguinous drainage from one of the wounds on the R ring finger.  There is streaking going up both arms.  There is lymphadenopathy around the R elbow.  Sensation intact in median, radial, and ulnar aspects.   Pertinent Labs: n/a  Imaging: I have personally reviewed the following studies: Xrs of bilateral hands do not demonstrate any obvious effusion or subcutaneous gas.  Additional  Studies: n/a  Assessment/Plan: Cellulitis of upper extremity, unspecified laterality  Given the locations of the bites and the fact that the bites happened yesterday, it appears she has developed cellulitis and seems less likely that she has septic arthritis but this is still on the differential primarily for the Right ring finger so we will obtain MRIs of the bilateral hands to evaluate for effusion or communication with the joint or any abscesses.  It seems less likely to be cat scratch fever given how quickly the cellulitis has appeared but I would recommend covering for it in addition to more common pathogens.  Recommend broad spectrum IV antibiotics.  Please keep NPO at this time.  Depending on MRI results, may plan to observe overnight for improvement on IV antibiotics before decision is made for surgery.    I recommend antibacterial soap soaks TID while admitted    Antibacterial soap soaks  Supplies needed: -Liquid antibacterial soap: Hibiclens or Dial antibacterial soap -clean basin large enough to submerge wound (to be rinsed and dried between uses) -clean dry towel -Sterile telfa non-adhesive gauze -Sterile gauze -comfortable tape or self adhesive wrap (Coban or ACE)  Perform 3 times per day: Soak wound in warm soapy water for 10-15 minutes in clean basin Gently pat dry Place Telfa gauze over wound Cover with fresh sterile gauze and loose wrap   ADDENDUM: MRI concerning for right ring finger PIP effusion.  Discussed with patient who agreed to I&D of Right ring finger.  3cc of 2% lidocaine used for digital block.  1.5cm incision made on midaxial radial aspect of ring finger down to PIP joint.  No obvious purulence encountered.  Cultures obtained.  Wound thoroughly irrigated with 1L of normal saline.  Wound dressed with bulky gauze and kerlix.     Will plan for TID antibacterial soap soaks and continued IV abx, pending wound cultures.  Can have diet per primary   Shaune Pollack, MD Hand and Upper Extremity Surgery [facility] [phone] 08/01/2022 10:44 PM

## 2022-08-02 DIAGNOSIS — S61452A Open bite of left hand, initial encounter: Secondary | ICD-10-CM | POA: Diagnosis not present

## 2022-08-02 DIAGNOSIS — L089 Local infection of the skin and subcutaneous tissue, unspecified: Secondary | ICD-10-CM | POA: Diagnosis not present

## 2022-08-02 DIAGNOSIS — S61259A Open bite of unspecified finger without damage to nail, initial encounter: Secondary | ICD-10-CM | POA: Diagnosis not present

## 2022-08-02 DIAGNOSIS — W5501XA Bitten by cat, initial encounter: Secondary | ICD-10-CM

## 2022-08-02 LAB — AEROBIC CULTURE W GRAM STAIN (SUPERFICIAL SPECIMEN)

## 2022-08-02 LAB — CBC
HCT: 38.3 % (ref 36.0–46.0)
Hemoglobin: 13 g/dL (ref 12.0–15.0)
MCH: 32.2 pg (ref 26.0–34.0)
MCHC: 33.9 g/dL (ref 30.0–36.0)
MCV: 94.8 fL (ref 80.0–100.0)
Platelets: 216 10*3/uL (ref 150–400)
RBC: 4.04 MIL/uL (ref 3.87–5.11)
RDW: 12.7 % (ref 11.5–15.5)
WBC: 7.7 10*3/uL (ref 4.0–10.5)
nRBC: 0 % (ref 0.0–0.2)

## 2022-08-02 LAB — BASIC METABOLIC PANEL
Anion gap: 9 (ref 5–15)
BUN: 14 mg/dL (ref 6–20)
CO2: 24 mmol/L (ref 22–32)
Calcium: 8.4 mg/dL — ABNORMAL LOW (ref 8.9–10.3)
Chloride: 103 mmol/L (ref 98–111)
Creatinine, Ser: 0.66 mg/dL (ref 0.44–1.00)
GFR, Estimated: >60 mL/min (ref >60)
Glucose, Bld: 92 mg/dL (ref 70–99)
Potassium: 3.6 mmol/L (ref 3.5–5.1)
Sodium: 136 mmol/L (ref 135–145)

## 2022-08-02 LAB — C-REACTIVE PROTEIN: CRP: 0.7 mg/dL (ref <1.0)

## 2022-08-02 LAB — CULTURE, BLOOD (ROUTINE X 2): Culture: NO GROWTH

## 2022-08-02 LAB — HIV ANTIBODY (ROUTINE TESTING W REFLEX): HIV Screen 4th Generation wRfx: NONREACTIVE

## 2022-08-02 LAB — SEDIMENTATION RATE: Sed Rate: 8 mm/hr (ref 0–22)

## 2022-08-02 MED ORDER — RABIES VACCINE, PCEC IM SUSR
1.0000 mL | Freq: Once | INTRAMUSCULAR | Status: AC
  Administered 2022-08-04: 1 mL via INTRAMUSCULAR
  Filled 2022-08-02 (×2): qty 1

## 2022-08-02 MED ORDER — MORPHINE SULFATE (PF) 2 MG/ML IV SOLN
2.0000 mg | INTRAVENOUS | Status: DC | PRN
  Administered 2022-08-02 – 2022-08-03 (×4): 2 mg via INTRAVENOUS
  Filled 2022-08-02 (×4): qty 1

## 2022-08-02 MED ORDER — SODIUM CHLORIDE 0.9 % IV SOLN: INTRAVENOUS | Status: DC

## 2022-08-02 NOTE — Progress Notes (Signed)
  Transition of Care [facility]) Screening Note   Patient Details  Name: Yesenia Long Date of Birth: 03/31/1986   Transition of Care [facility]) CM/SW Contact:    Otelia Santee, LCSW Phone Number: 08/02/2022, 10:24 AM    Transition of Care Department [facility]) has reviewed patient and no TOC needs have been identified at this time. We will continue to monitor patient advancement through interdisciplinary progression rounds. If new patient transition needs arise, please place a TOC consult.

## 2022-08-02 NOTE — Progress Notes (Signed)
PROGRESS NOTE  Tacey Dimaggio ZOX:[id number] DOB: 06/17/1985 DOA: 08/01/2022 PCP: Collene Mares, PA  HPI/Recap of past 24 hours: Deriana Vanderhoef is a 37 y.o. female with medical history significant for anxiety who presented to the ED for evaluation of cat bite wounds. Patient states that she has an indoor cat which got loose and was missing outdoors for 8 days. Her cat returned to the house, and when patient tried to bring her cat in the home, it responded aggressively and attacked her. Patient suffered cat bites to multiple fingers of both hands as well as her left shoulder. Patient states cat was previously vaccinated but no longer up-to-date.  Her cat is currently on 10-day quarantine.  Patient went to urgent care [facility]TA and was prescribed Augmentin. Pt took 2 doses of the medication, but due to worsening swelling and erythema of both hands with diminished range of motion in her fingers, as well as red streaking of her left arm tracking from her hand to arm, she presented to the ED. In the ED, VSS, labs unremarkable. She was also given rabies immunoglobulin and vaccine. Hand surgeon was consulted. Patient admitted for further management.    Assessment/Plan: Principal Problem:   Cat bite of multiple sites of left hand and fingers with infection Active Problems:   Cat bite of right hand including fingers with infection  Cellulitis of multiple sites of both hands/L shoulder 2/2 cat bite S/p bedside I&D by hand surgeon on 08/02/2022, noted no purulence encountered Currently afebrile, with no leukocytosis MRI of both hands with noted cellulitis, suspicions for right ring finger PIP effusion BC x 2 NGTD Post op wound cultures sent, pending Hand surgeon Dr. Kerry Fort recommend continuing IV antibiotics, as well as 3 times daily antibacterial soap soaks pending wound culture RN reminded to perform 3 times daily soap soaks Continue IV ceftriaxone and Flagyl (reported penicillin allergy,  tolerating cephalosporins), oral azithromycin to cover for cat scratch disease BP on the softer side, started on IVF, monitor closely Per CDC guidelines, patient will require 4 doses of the rabies vaccine over 14 days (day 0, 3, 7 and 14) received 1st rabies vaccine and immune globulin on 4/25, next doses will be on 4/28, followed by 5/2 and last will be 5/9 to complete the series  History of anxiety Continue Lexapro    Estimated body mass index is 28.51 kg/m as calculated from the following:   Height as of this encounter: 5\' 7"  (1.702 m).   Weight as of this encounter: 82.6 kg.     Code Status: Full  Family Communication: None at bedside  Disposition Plan: Status is: Inpatient Remains inpatient appropriate because: Level of care      Consultants: Hand surgery  Procedures: Bedside I&D  Antimicrobials: Ceftriaxone, Flagyl, azithromycin  DVT prophylaxis: Lovenox   Objective: Vitals:   08/01/22 1812 08/01/22 2213 08/02/22 0210 08/02/22 0552  BP: 122/74 111/68 (!) 97/50 (!) 97/49  Pulse: 81 86 76 79  Resp: 18 18 18 18   Temp: 98 F (36.7 C) 98.2 F (36.8 C) 97.6 F (36.4 C) 97.7 F (36.5 C)  TempSrc: Oral Oral Oral Oral  SpO2: 98% 97% 97% 100%  Weight:      Height:        Intake/Output Summary (Last 24 hours) at 08/02/2022 1131 Last data filed at 08/02/2022 0300 Gross per 24 hour  Intake 194.79 ml  Output --  Net 194.79 ml   Filed Weights   08/01/22 1134 08/01/22 1323  Weight:  82.6 kg 82.6 kg    Exam: General: NAD  Cardiovascular: S1, S2 present Respiratory: CTAB Abdomen: Soft, nontender, nondistended, bowel sounds present Musculoskeletal: No bilateral pedal edema noted, swelling of bilateral hands noted with diminished ROM of the fingers Skin: Cat bite puncture wounds noted on fingers of both hands as well as some erythema and swelling on the dorsal aspect of the hands Psychiatry: Normal mood     Data Reviewed: CBC: Recent Labs  Lab  08/01/22 1426 08/02/22 0037  WBC 9.6 7.7  NEUTROABS 7.4  --   HGB 14.7 13.0  HCT 42.7 38.3  MCV 91.8 94.8  PLT 249 216   Basic Metabolic Panel: Recent Labs  Lab 08/01/22 1426 08/02/22 0037  NA 136 136  K 4.9 3.6  CL 104 103  CO2 25 24  GLUCOSE 82 92  BUN 12 14  CREATININE 0.63 0.66  CALCIUM 9.3 8.4*   GFR: Estimated Creatinine Clearance: 104.3 mL/min (by C-G formula based on SCr of 0.66 mg/dL). Liver Function Tests: Recent Labs  Lab 08/01/22 1426  AST 27  ALT 17  ALKPHOS 45  BILITOT 0.5  PROT 7.2  ALBUMIN 4.7   No results for input(s): "LIPASE", "AMYLASE" in the last 168 hours. No results for input(s): "AMMONIA" in the last 168 hours. Coagulation Profile: No results for input(s): "INR", "PROTIME" in the last 168 hours. Cardiac Enzymes: No results for input(s): "CKTOTAL", "CKMB", "CKMBINDEX", "TROPONINI" in the last 168 hours. BNP (last 3 results) No results for input(s): "PROBNP" in the last 8760 hours. HbA1C: No results for input(s): "HGBA1C" in the last 72 hours. CBG: No results for input(s): "GLUCAP" in the last 168 hours. Lipid Profile: No results for input(s): "CHOL", "HDL", "LDLCALC", "TRIG", "CHOLHDL", "LDLDIRECT" in the last 72 hours. Thyroid Function Tests: No results for input(s): "TSH", "T4TOTAL", "FREET4", "T3FREE", "THYROIDAB" in the last 72 hours. Anemia Panel: No results for input(s): "VITAMINB12", "FOLATE", "FERRITIN", "TIBC", "IRON", "RETICCTPCT" in the last 72 hours. Urine analysis: No results found for: "COLORURINE", "APPEARANCEUR", "LABSPEC", "PHURINE", "GLUCOSEU", "HGBUR", "BILIRUBINUR", "KETONESUR", "PROTEINUR", "UROBILINOGEN", "NITRITE", "LEUKOCYTESUR" Sepsis Labs: @LABRCNTIP (procalcitonin:4,lacticidven:4)  ) Recent Results (from the past 240 hour(s))  Blood culture (routine x 2)     Status: None (Preliminary result)   Collection Time: 08/01/22  2:26 PM   Specimen: BLOOD LEFT FOREARM  Result Value Ref Range Status   Specimen  Description   Final    BLOOD LEFT FOREARM Performed at [facility], [address] [place], [place] [address]    Special Requests   Final    BOTTLES DRAWN AEROBIC AND ANAEROBIC Blood Culture adequate volume Performed at [facility], [address] [place], [place] [address]    Culture   Final    NO GROWTH < 24 HOURS Performed at [facility], [address], [place], [place] [address]    Report Status PENDING  Incomplete  Blood culture (routine x 2)     Status: None (Preliminary result)   Collection Time: 08/01/22  2:26 PM   Specimen: BLOOD  Result Value Ref Range Status   Specimen Description   Final    BLOOD RIGHT ANTECUBITAL Performed at [facility], [address], [place], [place] [address]    Special Requests   Final    BOTTLES DRAWN AEROBIC AND ANAEROBIC Blood Culture results may not be optimal due to an excessive volume of blood received in culture bottles Performed at [facility], [address] [place], [place] [address]  Culture   Final    NO GROWTH < 24 HOURS Performed at [facility], [address], [place], [place] [address]    Report Status PENDING  Incomplete      Studies: MR HAND RIGHT W WO CONTRAST  Result Date: 08/02/2022 CLINICAL DATA:  Soft tissue infection suspected. Cat bites at multiple sites of right hand and fingers 07/31/2022. Multiple puncture wounds to posterior surface of hand and fingers. Swelling. EXAM: MRI OF THE RIGHT HAND WITHOUT AND WITH CONTRAST TECHNIQUE: Multiplanar, multisequence MR imaging of the right hand was performed before and after the administration of intravenous contrast. CONTRAST:  8mL GADAVIST GADOBUTROL 1 MMOL/ML IV SOLN COMPARISON:  Right hand radiographs 08/01/2022 FINDINGS: Bones/Joint/Cartilage Normal marrow signal without edema or enhancement. The cortices are intact. No MRI evidence of acute osteomyelitis. Mild thumb carpometacarpal  cartilage thinning and peripheral degenerative spurring. There are mild-to-moderate fourth PIP, mild-to-moderate third PIP, and mild second PIP joint effusions. Ligaments The metacarpophalangeal and interphalangeal collateral ligaments appear intact. Muscles and Tendons There is trace fluid within the third dorsal extensor tendon sheath at the level of the metacarpophalangeal joints and base of the proximal phalanx (axial series 4, image 25) with minimal peripheral synovial enhancement. There is mild fluid within the third and fourth flexor tendon compartments at the level of the distal shaft and metacarpal head (axial series 4 images 19 through 22) with trace peripheral synovial enhancement. There appears to be a tiny defect within the region of the fourth finger extensor mechanism/PIP joint extensor expansion hood at the level of the PIP joint/head of the proximal phalanx (axial series 4, image 32, axial series 11, image 34), and it is difficult to exclude a tiny partial-thickness tear in this region, possibly from the extensor expansion hood, from the reported cat bites. This is tiny and possibly clinically insignificant. Soft tissues There is moderate edema and soft tissue swelling of the subcutaneous fat dorsal to the third through fifth metacarpals, extending from the proximal metacarpal shafts through the distal metacarpals and continuing into the third and fourth greater than second fingers. At the level of the metacarpals, this region measures up to approximately 5.5 x 0.9 x 6.1 cm in greatest transverse by AP by craniocaudal dimensions (as measured on axial series 4, image 18 and sagittal series 7, image 22). There is mild peripheral enhancement, however no walled-off abscess is seen. This appears to represent cellulitis and developing phlegmon. There is moderate diffuse third and fourth and mild 2nd finger soft tissue enhancement. IMPRESSION: 1. There is moderate edema and soft tissue swelling of the  subcutaneous fat dorsal to the third through fifth metacarpals, extending from the proximal metacarpal shafts through the distal metacarpals and continuing into the third and fourth greater than second fingers. There is mild peripheral enhancement of the greatest subcutaneous fat edema at the level of the distal metacarpals,, however no walled-off abscess is seen. This appears to represent cellulitis and possible developing phlegmon. 2. There is trace fluid within the third extensor tendon sheath at the level of the metacarpophalangeal joints and base of the proximal phalanx with minimal peripheral synovial enhancement. There is also mild fluid within the third and fourth flexor tendon sheath at the level of the distal shaft and metacarpal head with trace peripheral synovial enhancement. MRI cannot exclude the possible presence of infection within this tendon sheath fluid. 3. Possible punctate defect within the region of the fourth finger extensor mechanism/PIP joint extensor expansion hood at the level of the  PIP joint/head of the proximal phalanx. Due to the small size, this may be clinically insignificant. Recommend clinical correlation. 4. No osteomyelitis. Electronically Signed   By: Neita Garnet M.D.   On: 08/02/2022 09:07   MR HAND LEFT W WO CONTRAST  Result Date: 08/02/2022 CLINICAL DATA:  Soft tissue infection suspected. Cat bites on multiple sides of the bilateral hands and fingers with infection 07/31/2022. Multiple puncture wounds to posterior surface of fingers. EXAM: MRI OF THE LEFT HAND WITHOUT AND WITH CONTRAST TECHNIQUE: Multiplanar, multisequence MR imaging of the left hand was performed before and after the administration of intravenous contrast. CONTRAST:  8mL GADAVIST GADOBUTROL 1 MMOL/ML IV SOLN COMPARISON:  Left hand radiographs 08/01/2022 FINDINGS: Bones/Joint/Cartilage Normal marrow signal. The cortices are intact. No acute fracture. No MRI findings of acute osteomyelitis. Joint spaces  are maintained. Mild thumb carpometacarpal cartilage thinning and peripheral degenerative spurring.a There is a tiny index finger PIP joint effusion (axial series 4, image 31, sagittal series 7, image 31). No associated synovial enhancement. Ligaments The collateral ligaments of the metacarpophalangeal and interphalangeal joints appear intact. Muscles and Tendons There is trace fluid within the third and fourth extensor tendon sheaths at the level of the proximal and mid metacarpals. A focal focus of extensor tendon sheath enhancement is seen within the third finger at the level of third metacarpal neck (axial series 9, image 20). Additional mild focal synovial enhancement within the extensor tendon sheath at the level of the third finger PIP joint (axial series 9, image 33). Soft tissues There is moderate subcutaneous fat edema and swelling dorsal to the second through fourth carpometacarpal joints, dorsal to the second through fourth proximal distal metacarpals, and dorsal to the third finger proximal phalanx and more diffusely (palmar and dorsal) throughout the mid to distal third finger. There is mildly confluent fluid around the border of this region (e.g. axial series 4, image 16), greatest at the level of the mid metacarpal shafts, however this does not appear to enhance, no well-defined wall of an abscess is seen at this time. The soft tissue swelling and apparent developing phlegmon involves a region measuring up to 4.9 cm in transverse dimension (axial image 16), 0.8 cm in AP thickness (axial image 16) and 8.7 cm along the dorsal aspect of the hand at the level of the metacarpals (as measured on sagittal series 7, image 21). IMPRESSION: 1. There is moderate subcutaneous fat edema and swelling dorsal to the second through fourth metacarpals and extending into the third finger. There is mildly confluence fluid around the border of this region, greatest at the level of the mid metacarpal shafts, however this  does not appear to enhance, and no well-defined wall of an abscess is seen at this time. This presumably reflects cellulitis and inflammatory phlegmon. 2. There is only trace fluid within the third and fourth extensor tendon sheaths at the level of the proximal and mid metacarpals. No tendon sheath enhancement in this region. MRI cannot exclude the presence of infection within this fluid. 3. There is a focal focus of extensor tendon sheath enhancement within the third finger at the level of the third metacarpal neck and again focally within the extensor tendon sheath at the level of the PIP joint. 4. There is a tiny index finger PIP joint effusion. No associated synovial enhancement. 5. No MRI findings of acute osteomyelitis. Electronically Signed   By: Neita Garnet M.D.   On: 08/02/2022 08:37   DG Hand Complete Right  Result Date:  08/01/2022 CLINICAL DATA:  Multiple cat bites to hands and fingers 07/31/2022. Multiple puncture wounds. Swelling and infection. EXAM: RIGHT HAND - COMPLETE 3+ VIEW COMPARISON:  None Available. FINDINGS: Normal bone mineralization. Joint spaces are preserved. The cortices are intact. Mild soft tissue swelling and edema greatest at the dorsal aspect of the metacarpal heads. Finger soft tissue swelling greatest at the proximal third and fourth greater than second fingers. No subcutaneous air. No radiopaque foreign body. IMPRESSION: Soft tissue swelling as above. No subcutaneous air or radiopaque foreign body. No cortical erosion. Electronically Signed   By: Neita Garnet M.D.   On: 08/01/2022 20:35   DG Hand Complete Left  Result Date: 08/01/2022 CLINICAL DATA:  Soft tissue infection suspected. Cat bite of multiple sites of right hand and fingers with infection 07/31/2022. Multiple puncture wounds to posterior surface hands and fingers. EXAM: LEFT HAND - COMPLETE 3+ VIEW COMPARISON:  None Available. FINDINGS: Normal bone mineralization. Joint spaces are preserved. The cortices are  intact. No subcutaneous air. No radiopaque foreign bodies are seen. Mild soft tissue swelling at the dorsal aspect of the metacarpals and of the proximal fingers, especially the third greater than second finger. IMPRESSION: Soft tissue swelling as above. No radiopaque foreign body or cortical erosion. Electronically Signed   By: Neita Garnet M.D.   On: 08/01/2022 20:33    Scheduled Meds:  [START ON 08/03/2022] azithromycin  250 mg Oral Daily   enoxaparin (LOVENOX) injection  40 mg Subcutaneous Q24H   escitalopram  10 mg Oral Daily    Continuous Infusions:  sodium chloride 100 mL/hr at 08/02/22 0919   cefTRIAXone (ROCEPHIN)  IV 2 g (08/02/22 2595)   metronidazole 500 mg (08/02/22 1000)     LOS: 1 day     Briant Cedar, MD [facility]  If 7PM-7AM, please contact night-coverage [email or web address] 08/02/2022, 11:31 AM

## 2022-08-02 NOTE — Progress Notes (Signed)
Patient resting in bed this morning. Reports she's not had a significant change in symptoms, although she does not feel that her symptoms have worsened at all. White count decreased this morning. CRP and ESR normal.  Right hand in bandages from overnight I&D. Left hand, possibly slightly improved erythema. Able to range fingers with slight discomfort. Unable to make a fist due to pain.   Bilateral hand cellulitis with  bedside I&D of the right ring finger.   Please start Q8 hour antibacterial soap soaks per instructions in my consult note.   Recommend continue broad-spectrum IV and antibiotics that include coverage for Bartonella and Pasturella until cultures return.    Ramon Dredge, MD  [facility]  [phone]

## 2022-08-03 DIAGNOSIS — W5501XA Bitten by cat, initial encounter: Secondary | ICD-10-CM | POA: Diagnosis not present

## 2022-08-03 DIAGNOSIS — L089 Local infection of the skin and subcutaneous tissue, unspecified: Secondary | ICD-10-CM | POA: Diagnosis not present

## 2022-08-03 DIAGNOSIS — S61259A Open bite of unspecified finger without damage to nail, initial encounter: Secondary | ICD-10-CM | POA: Diagnosis not present

## 2022-08-03 DIAGNOSIS — S61452A Open bite of left hand, initial encounter: Secondary | ICD-10-CM | POA: Diagnosis not present

## 2022-08-03 LAB — BASIC METABOLIC PANEL
Anion gap: 8 (ref 5–15)
BUN: 12 mg/dL (ref 6–20)
CO2: 21 mmol/L — ABNORMAL LOW (ref 22–32)
Calcium: 8.1 mg/dL — ABNORMAL LOW (ref 8.9–10.3)
Chloride: 107 mmol/L (ref 98–111)
Creatinine, Ser: 0.55 mg/dL (ref 0.44–1.00)
GFR, Estimated: >60 mL/min (ref >60)
Glucose, Bld: 81 mg/dL (ref 70–99)
Potassium: 4.2 mmol/L (ref 3.5–5.1)
Sodium: 136 mmol/L (ref 135–145)

## 2022-08-03 LAB — CBC WITH DIFFERENTIAL/PLATELET
Abs Immature Granulocytes: 0.01 10*3/uL (ref 0.00–0.07)
Basophils Absolute: 0 10*3/uL (ref 0.0–0.1)
Basophils Relative: 1 %
Eosinophils Absolute: 0.1 10*3/uL (ref 0.0–0.5)
Eosinophils Relative: 2 %
HCT: 38 % (ref 36.0–46.0)
Hemoglobin: 12.3 g/dL (ref 12.0–15.0)
Immature Granulocytes: 0 %
Lymphocytes Relative: 36 %
Lymphs Abs: 1.5 10*3/uL (ref 0.7–4.0)
MCH: 31.9 pg (ref 26.0–34.0)
MCHC: 32.4 g/dL (ref 30.0–36.0)
MCV: 98.7 fL (ref 80.0–100.0)
Monocytes Absolute: 0.5 10*3/uL (ref 0.1–1.0)
Monocytes Relative: 12 %
Neutro Abs: 2 10*3/uL (ref 1.7–7.7)
Neutrophils Relative %: 49 %
Platelets: 151 10*3/uL (ref 150–400)
RBC: 3.85 MIL/uL — ABNORMAL LOW (ref 3.87–5.11)
RDW: 12.8 % (ref 11.5–15.5)
WBC: 4.1 10*3/uL (ref 4.0–10.5)
nRBC: 0 % (ref 0.0–0.2)

## 2022-08-03 LAB — AEROBIC CULTURE W GRAM STAIN (SUPERFICIAL SPECIMEN): Culture: NO GROWTH

## 2022-08-03 LAB — CULTURE, BLOOD (ROUTINE X 2)

## 2022-08-03 NOTE — Progress Notes (Signed)
PROGRESS NOTE  Yesenia Long UJW:[id number] DOB: Aug 29, 1985 DOA: 08/01/2022 PCP: Collene Mares, PA  HPI/Recap of past 24 hours: Yesenia Long is a 37 y.o. female with medical history significant for anxiety who presented to the ED for evaluation of cat bite wounds. Patient states that she has an indoor cat which got loose and was missing outdoors for 8 days. Her cat returned to the house, and when patient tried to bring her cat in the home, it responded aggressively and attacked her. Patient suffered cat bites to multiple fingers of both hands as well as her left shoulder. Patient states cat was previously vaccinated but no longer up-to-date.  Her cat is currently on 10-day quarantine.  Patient went to urgent care [facility] and was prescribed Augmentin. Pt took 2 doses of the medication, but due to worsening swelling and erythema of both hands with diminished range of motion in her fingers, as well as red streaking of her left arm tracking from her hand to arm, she presented to the ED. In the ED, VSS, labs unremarkable. She was also given rabies immunoglobulin and vaccine. Hand surgeon was consulted. Patient admitted for further management.    Assessment/Plan: Principal Problem:   Cat bite of multiple sites of left hand and fingers with infection Active Problems:   Cat bite of right hand including fingers with infection  Cellulitis of multiple sites of both hands/L shoulder 2/2 cat bite S/p bedside I&D by hand surgeon on 08/02/2022, noted no purulence encountered Currently afebrile, with no leukocytosis MRI of both hands with noted cellulitis, suspicions for right ring finger PIP effusion BC x 2 NGTD Post op wound cultures sent, NGTD Hand surgeon Dr. Kerry Fort recommend continuing IV antibiotics, as well as 3 times daily antibacterial soap soaks pending wound culture RN reminded to perform 3 times daily soap soaks Continue IV ceftriaxone and Flagyl (reported penicillin allergy, tolerating  cephalosporins), oral azithromycin to cover for cat scratch disease Per CDC guidelines, patient will require 4 doses of the rabies vaccine over 14 days (day 0, 3, 7 and 14) received 1st rabies vaccine and immune globulin on 4/25, next doses will be on 4/28, followed by 5/2 and last will be 5/9 to complete the series  History of anxiety Continue Lexapro    Estimated body mass index is 28.51 kg/m as calculated from the following:   Height as of this encounter: 5\' 7"  (1.702 m).   Weight as of this encounter: 82.6 kg.     Code Status: Full  Family Communication: None at bedside  Disposition Plan: Status is: Inpatient Remains inpatient appropriate because: Level of care      Consultants: Hand surgery  Procedures: Bedside I&D  Antimicrobials: Ceftriaxone, Flagyl, azithromycin  DVT prophylaxis: Lovenox   Objective: Vitals:   08/02/22 1414 08/02/22 2015 08/03/22 0522 08/03/22 1209  BP: (!) 106/59 120/66 (!) 103/58 110/70  Pulse: 76 68 (!) 59 (!) 59  Resp: 16 19 14 18   Temp: 98 F (36.7 C) 97.8 F (36.6 C) 98 F (36.7 C) 98.2 F (36.8 C)  TempSrc: Oral Oral Oral Oral  SpO2: 96% 100% 96% 99%  Weight:      Height:        Intake/Output Summary (Last 24 hours) at 08/03/2022 1454 Last data filed at 08/03/2022 1258 Gross per 24 hour  Intake 3114.68 ml  Output 1 ml  Net 3113.68 ml   Filed Weights   08/01/22 1134 08/01/22 1323  Weight: 82.6 kg 82.6 kg  Exam: General: NAD  Cardiovascular: S1, S2 present Respiratory: CTAB Abdomen: Soft, nontender, nondistended, bowel sounds present Musculoskeletal: No bilateral pedal edema noted, swelling of bilateral hands noted with diminished ROM of the fingers Skin: Cat bite puncture wounds noted on fingers of both hands as well as some erythema and swelling on the dorsal aspect of the hands Psychiatry: Normal mood     Data Reviewed: CBC: Recent Labs  Lab 08/01/22 1426 08/02/22 0037 08/03/22 0652  WBC 9.6 7.7 4.1   NEUTROABS 7.4  --  2.0  HGB 14.7 13.0 12.3  HCT 42.7 38.3 38.0  MCV 91.8 94.8 98.7  PLT 249 216 151   Basic Metabolic Panel: Recent Labs  Lab 08/01/22 1426 08/02/22 0037 08/03/22 0652  NA 136 136 136  K 4.9 3.6 4.2  CL 104 103 107  CO2 25 24 21*  GLUCOSE 82 92 81  BUN 12 14 12   CREATININE 0.63 0.66 0.55  CALCIUM 9.3 8.4* 8.1*   GFR: Estimated Creatinine Clearance: 104.3 mL/min (by C-G formula based on SCr of 0.55 mg/dL). Liver Function Tests: Recent Labs  Lab 08/01/22 1426  AST 27  ALT 17  ALKPHOS 45  BILITOT 0.5  PROT 7.2  ALBUMIN 4.7   No results for input(s): "LIPASE", "AMYLASE" in the last 168 hours. No results for input(s): "AMMONIA" in the last 168 hours. Coagulation Profile: No results for input(s): "INR", "PROTIME" in the last 168 hours. Cardiac Enzymes: No results for input(s): "CKTOTAL", "CKMB", "CKMBINDEX", "TROPONINI" in the last 168 hours. BNP (last 3 results) No results for input(s): "PROBNP" in the last 8760 hours. HbA1C: No results for input(s): "HGBA1C" in the last 72 hours. CBG: No results for input(s): "GLUCAP" in the last 168 hours. Lipid Profile: No results for input(s): "CHOL", "HDL", "LDLCALC", "TRIG", "CHOLHDL", "LDLDIRECT" in the last 72 hours. Thyroid Function Tests: No results for input(s): "TSH", "T4TOTAL", "FREET4", "T3FREE", "THYROIDAB" in the last 72 hours. Anemia Panel: No results for input(s): "VITAMINB12", "FOLATE", "FERRITIN", "TIBC", "IRON", "RETICCTPCT" in the last 72 hours. Urine analysis: No results found for: "COLORURINE", "APPEARANCEUR", "LABSPEC", "PHURINE", "GLUCOSEU", "HGBUR", "BILIRUBINUR", "KETONESUR", "PROTEINUR", "UROBILINOGEN", "NITRITE", "LEUKOCYTESUR" Sepsis Labs: @LABRCNTIP (procalcitonin:4,lacticidven:4)  ) Recent Results (from the past 240 hour(s))  Blood culture (routine x 2)     Status: None (Preliminary result)   Collection Time: 08/01/22  2:26 PM   Specimen: BLOOD LEFT FOREARM  Result Value Ref  Range Status   Specimen Description   Final    BLOOD LEFT FOREARM Performed at [facility], [address], [place], [place] [address]    Special Requests   Final    BOTTLES DRAWN AEROBIC AND ANAEROBIC Blood Culture adequate volume Performed at [facility], [address] [place], [place] [address]    Culture   Final    NO GROWTH 2 DAYS Performed at [facility], [address] [place], [place] [address]    Report Status PENDING  Incomplete  Blood culture (routine x 2)     Status: None (Preliminary result)   Collection Time: 08/01/22  2:26 PM   Specimen: BLOOD  Result Value Ref Range Status   Specimen Description   Final    BLOOD RIGHT ANTECUBITAL Performed at [facility], [address] [place], [place] [address]    Special Requests   Final    BOTTLES DRAWN AEROBIC AND ANAEROBIC Blood Culture results may not be optimal due to an excessive volume of blood received in culture bottles Performed at  [facility], [address], [place], [place] [address]    Culture   Final    NO GROWTH 2 DAYS Performed at [facility], [address], [place], [place] [address]    Report Status PENDING  Incomplete  Aerobic Culture w Gram Stain (superficial specimen)     Status: None (Preliminary result)   Collection Time: 08/02/22 12:43 AM   Specimen: Abscess  Result Value Ref Range Status   Specimen Description   Final    ABSCESS RT HAND Performed at [facility], [address], [place], [place] [address]    Special Requests   Final    NONE Performed at [facility], [address], [place], [place] [address]    Gram Stain NO WBC SEEN NO ORGANISMS SEEN   Final   Culture   Final    NO GROWTH 1 DAY Performed at [facility], [address], [place], [place] [address]    Report Status PENDING  Incomplete      Studies: No results found.  Scheduled Meds:   azithromycin  250 mg Oral Daily   enoxaparin (LOVENOX) injection  40 mg Subcutaneous Q24H   escitalopram  10 mg Oral Daily   [START ON 08/04/2022] rabies vaccine  1 mL Intramuscular Once    Continuous Infusions:  sodium chloride 100 mL/hr at 08/03/22 0643   cefTRIAXone (ROCEPHIN)  IV Stopped (08/03/22 0914)   metronidazole 500 mg (08/03/22 0956)     LOS: 2 days     Briant Cedar, MD [facility]  If 7PM-7AM, please contact night-coverage [email or web address] 08/03/2022, 2:54 PM

## 2022-08-03 NOTE — Plan of Care (Signed)

## 2022-08-04 ENCOUNTER — Encounter (HOSPITAL_COMMUNITY): Payer: Self-pay | Admitting: Internal Medicine

## 2022-08-04 DIAGNOSIS — W5501XA Bitten by cat, initial encounter: Secondary | ICD-10-CM | POA: Diagnosis not present

## 2022-08-04 DIAGNOSIS — L089 Local infection of the skin and subcutaneous tissue, unspecified: Secondary | ICD-10-CM | POA: Diagnosis not present

## 2022-08-04 DIAGNOSIS — S61259A Open bite of unspecified finger without damage to nail, initial encounter: Secondary | ICD-10-CM | POA: Diagnosis not present

## 2022-08-04 DIAGNOSIS — S61452A Open bite of left hand, initial encounter: Secondary | ICD-10-CM | POA: Diagnosis not present

## 2022-08-04 LAB — CULTURE, BLOOD (ROUTINE X 2): Culture: NO GROWTH

## 2022-08-04 LAB — AEROBIC CULTURE W GRAM STAIN (SUPERFICIAL SPECIMEN): Gram Stain: NONE SEEN

## 2022-08-04 NOTE — Progress Notes (Signed)
Patient improving with pain and swelling. Feels that her right ring finger is about a five out of 10 in pain from a seven or eight out 10. She reports that she still has pain when trying to flex or extend the ring finger PIP joint. Patient reports she has been having TID antibacterial soaks as ordered. Cultures from bedside I&D have had no growth to date.  On exam, her swelling and erythema have gone down considerably on both  hands. There is still some mild erythema and swelling around the PIP joint of the right ring finger. Pain with attempted range of motion of the right ring finger. Incision site is scabbed over. Able to make a fist with left hand. Able to make a near complete fist on right with the exception of the ring finger.   Discussed with patient that this may need another I&D. If so, I will likely take to the OR. Given how much improveme nt she has had though, it may be that she is just still painful from the severe cellulitis and presumed early septic arthritis of the right ring PIP as she has had considerable improvement in erythema and swelling and has had some improvement in pain in that finger. Will plan for repeat exam tomorrow and depending on if she has improved or not, will consider taking to OR for repeat I&D. She is in agreement with the plan.   Ple ase continue IV abx, antibacterial soaks. Please make NPO at 8am tomorrow (Monday 4/29) for possible surgery tomorrow evening.

## 2022-08-04 NOTE — Progress Notes (Signed)
PROGRESS NOTE  Yesenia Long ZOX:[id number] DOB: 18-Jul-1985 DOA: 08/01/2022 PCP: Collene Mares, PA  HPI/Recap of past 24 hours: Yesenia Long is a 37 y.o. female with medical history significant for anxiety who presented to the ED for evaluation of cat bite wounds. Patient states that she has an indoor cat which got loose and was missing outdoors for 8 days. Her cat returned to the house, and when patient tried to bring her cat in the home, it responded aggressively and attacked her. Patient suffered cat bites to multiple fingers of both hands as well as her left shoulder. Patient states cat was previously vaccinated but no longer up-to-date.  Her cat is currently on 10-day quarantine.  Patient went to urgent care PTA and was prescribed Augmentin. Pt took 2 doses of the medication, but due to worsening swelling and erythema of both hands with diminished range of motion in her fingers, as well as red streaking of her left arm tracking from her hand to arm, she presented to the ED. In the ED, VSS, labs unremarkable. She was also given rabies immunoglobulin and vaccine. Hand surgeon was consulted. Patient admitted for further management.    Assessment/Plan: Principal Problem:   Cat bite of multiple sites of left hand and fingers with infection Active Problems:   Cat bite of right hand including fingers with infection    Cellulitis of multiple sites of both hands/L shoulder 2/2 cat bite S/p bedside I&D by hand surgeon on 08/02/2022, noted no purulence encountered Currently afebrile, with no leukocytosis MRI of both hands with noted cellulitis, suspicions for right ring finger PIP effusion BC x 2 NGTD Post op wound cultures sent, NGTD Hand surgeon Dr. Kerry Fort recommend continuing IV antibiotics, as well as 3 times daily antibacterial soap soaks  RN to perform 3 times daily soap soaks Continue IV ceftriaxone and Flagyl (reported penicillin allergy, tolerating cephalosporins), oral  azithromycin to cover for cat scratch disease Per CDC guidelines, patient will require 4 doses of the rabies vaccine over 14 days (day 0, 3, 7 and 14) received 1st rabies vaccine and immune globulin on 4/25, next dose will be on 4/28, followed by 5/2 and last will be 5/9 to complete the series  History of anxiety Continue Lexapro    Estimated body mass index is 28.51 kg/m as calculated from the following:   Height as of this encounter: 5\' 7"  (1.702 m).   Weight as of this encounter: 82.6 kg.     Code Status: Full  Family Communication: None at bedside  Disposition Plan: Status is: Inpatient Remains inpatient appropriate because: Level of care      Consultants: Hand surgery  Procedures: Bedside I&D  Antimicrobials: Ceftriaxone, Flagyl, azithromycin  DVT prophylaxis: Lovenox   Objective: Vitals:   08/03/22 1541 08/03/22 1934 08/04/22 0523 08/04/22 1222  BP: 119/76 (!) 127/59 (!) 98/55 117/73  Pulse: 63 70 (!) 58 (!) 57  Resp: 20 20 15 16   Temp: 98 F (36.7 C) 98.4 F (36.9 C) 98.1 F (36.7 C) 97.6 F (36.4 C)  TempSrc:  Oral Oral Oral  SpO2: 100% 100% 99% 100%  Weight:      Height:        Intake/Output Summary (Last 24 hours) at 08/04/2022 1542 Last data filed at 08/04/2022 1300 Gross per 24 hour  Intake 840 ml  Output --  Net 840 ml   Filed Weights   08/01/22 1134 08/01/22 1323  Weight: 82.6 kg 82.6 kg    Exam: General:  NAD  Cardiovascular: S1, S2 present Respiratory: CTAB Abdomen: Soft, nontender, nondistended, bowel sounds present Musculoskeletal: No bilateral pedal edema noted, mild swelling of R hand noted with diminished ROM of the fingers Skin: Cat bite puncture wounds noted on fingers of both hands as well as some erythema and swelling on the dorsal aspect of the hands Psychiatry: Normal mood     Data Reviewed: CBC: Recent Labs  Lab 08/01/22 1426 08/02/22 0037 08/03/22 0652  WBC 9.6 7.7 4.1  NEUTROABS 7.4  --  2.0  HGB 14.7  13.0 12.3  HCT 42.7 38.3 38.0  MCV 91.8 94.8 98.7  PLT 249 216 151   Basic Metabolic Panel: Recent Labs  Lab 08/01/22 1426 08/02/22 0037 08/03/22 0652  NA 136 136 136  K 4.9 3.6 4.2  CL 104 103 107  CO2 25 24 21*  GLUCOSE 82 92 81  BUN 12 14 12   CREATININE 0.63 0.66 0.55  CALCIUM 9.3 8.4* 8.1*   GFR: Estimated Creatinine Clearance: 104.3 mL/min (by C-G formula based on SCr of 0.55 mg/dL). Liver Function Tests: Recent Labs  Lab 08/01/22 1426  AST 27  ALT 17  ALKPHOS 45  BILITOT 0.5  PROT 7.2  ALBUMIN 4.7   No results for input(s): "LIPASE", "AMYLASE" in the last 168 hours. No results for input(s): "AMMONIA" in the last 168 hours. Coagulation Profile: No results for input(s): "INR", "PROTIME" in the last 168 hours. Cardiac Enzymes: No results for input(s): "CKTOTAL", "CKMB", "CKMBINDEX", "TROPONINI" in the last 168 hours. BNP (last 3 results) No results for input(s): "PROBNP" in the last 8760 hours. HbA1C: No results for input(s): "HGBA1C" in the last 72 hours. CBG: No results for input(s): "GLUCAP" in the last 168 hours. Lipid Profile: No results for input(s): "CHOL", "HDL", "LDLCALC", "TRIG", "CHOLHDL", "LDLDIRECT" in the last 72 hours. Thyroid Function Tests: No results for input(s): "TSH", "T4TOTAL", "FREET4", "T3FREE", "THYROIDAB" in the last 72 hours. Anemia Panel: No results for input(s): "VITAMINB12", "FOLATE", "FERRITIN", "TIBC", "IRON", "RETICCTPCT" in the last 72 hours. Urine analysis: No results found for: "COLORURINE", "APPEARANCEUR", "LABSPEC", "PHURINE", "GLUCOSEU", "HGBUR", "BILIRUBINUR", "KETONESUR", "PROTEINUR", "UROBILINOGEN", "NITRITE", "LEUKOCYTESUR" Sepsis Labs: @LABRCNTIP (procalcitonin:4,lacticidven:4)  ) Recent Results (from the past 240 hour(s))  Blood culture (routine x 2)     Status: None (Preliminary result)   Collection Time: 08/01/22  2:26 PM   Specimen: BLOOD LEFT FOREARM  Result Value Ref Range Status   Specimen Description    Final    BLOOD LEFT FOREARM Performed at [facility], [address] [place], [place] [address]    Special Requests   Final    BOTTLES DRAWN AEROBIC AND ANAEROBIC Blood Culture adequate volume Performed at [facility], [address] [place], [place] [address]    Culture   Final    NO GROWTH 3 DAYS Performed at [facility], [address] [place], [place] [address]    Report Status PENDING  Incomplete  Blood culture (routine x 2)     Status: None (Preliminary result)   Collection Time: 08/01/22  2:26 PM   Specimen: BLOOD  Result Value Ref Range Status   Specimen Description   Final    BLOOD RIGHT ANTECUBITAL Performed at [facility], [address], [place], [place] [address]    Special Requests   Final    BOTTLES DRAWN AEROBIC AND ANAEROBIC Blood Culture results may not be optimal due to an excessive volume of blood received in culture bottles Performed at [facility], [address], [place], [place] [address]    Culture   Final    NO GROWTH 3 DAYS Performed at [facility], [address] [place], [place] [address]    Report Status PENDING  Incomplete  Aerobic Culture w Gram Stain (superficial specimen)     Status: None (Preliminary result)   Collection Time: 08/02/22 12:43 AM   Specimen: Abscess  Result Value Ref Range Status   Specimen Description   Final    ABSCESS RT HAND Performed at [facility], [address] [place], [place] [address]    Special Requests   Final    NONE Performed at [facility], [address] [place], [place] [address]    Gram Stain NO WBC SEEN NO ORGANISMS SEEN   Final   Culture   Final    NO GROWTH 2 DAYS Performed at [facility], [address], [place], [place] [address]    Report Status PENDING  Incomplete      Studies: No results found.  Scheduled Meds:  azithromycin  250 mg Oral Daily    enoxaparin (LOVENOX) injection  40 mg Subcutaneous Q24H   escitalopram  10 mg Oral Daily   rabies vaccine  1 mL Intramuscular Once    Continuous Infusions:  cefTRIAXone (ROCEPHIN)  IV 2 g (08/04/22 0829)   metronidazole 500 mg (08/04/22 0830)     LOS: 3 days     Briant Cedar, MD [facility]  If 7PM-7AM, please contact night-coverage [email or web address] 08/04/2022, 3:42 PM

## 2022-08-05 ENCOUNTER — Inpatient Hospital Stay (HOSPITAL_COMMUNITY): Payer: BC Managed Care – PPO | Admitting: Certified Registered"

## 2022-08-05 ENCOUNTER — Other Ambulatory Visit: Payer: Self-pay

## 2022-08-05 ENCOUNTER — Encounter (HOSPITAL_COMMUNITY)
Admission: EM | Disposition: A | Discharge status: Home / Self Care | Payer: Self-pay | Source: Home / Self Care | Attending: Internal Medicine

## 2022-08-05 ENCOUNTER — Encounter (HOSPITAL_COMMUNITY): Payer: Self-pay | Admitting: Internal Medicine

## 2022-08-05 DIAGNOSIS — T8140XA Infection following a procedure, unspecified, initial encounter: Secondary | ICD-10-CM | POA: Diagnosis not present

## 2022-08-05 DIAGNOSIS — S61452A Open bite of left hand, initial encounter: Secondary | ICD-10-CM | POA: Diagnosis not present

## 2022-08-05 DIAGNOSIS — L089 Local infection of the skin and subcutaneous tissue, unspecified: Secondary | ICD-10-CM | POA: Diagnosis not present

## 2022-08-05 DIAGNOSIS — W5501XA Bitten by cat, initial encounter: Secondary | ICD-10-CM | POA: Diagnosis not present

## 2022-08-05 DIAGNOSIS — S61259A Open bite of unspecified finger without damage to nail, initial encounter: Secondary | ICD-10-CM | POA: Diagnosis not present

## 2022-08-05 HISTORY — PX: INCISION AND DRAINAGE OF WOUND: SHX1803

## 2022-08-05 LAB — GASTROINTESTINAL PANEL BY PCR, STOOL (REPLACES STOOL CULTURE)

## 2022-08-05 LAB — CBC
HCT: 40.4 % (ref 36.0–46.0)
Hemoglobin: 13 g/dL (ref 12.0–15.0)
MCH: 31.6 pg (ref 26.0–34.0)
MCHC: 32.2 g/dL (ref 30.0–36.0)
MCV: 98.1 fL (ref 80.0–100.0)
Platelets: 188 10*3/uL (ref 150–400)
RBC: 4.12 MIL/uL (ref 3.87–5.11)
RDW: 12.5 % (ref 11.5–15.5)
WBC: 5.2 10*3/uL (ref 4.0–10.5)
nRBC: 0 % (ref 0.0–0.2)

## 2022-08-05 LAB — AEROBIC CULTURE W GRAM STAIN (SUPERFICIAL SPECIMEN)

## 2022-08-05 LAB — CULTURE, BLOOD (ROUTINE X 2)

## 2022-08-05 SURGERY — IRRIGATION AND DEBRIDEMENT WOUND
Anesthesia: General | Laterality: Right

## 2022-08-05 MED ORDER — LACTATED RINGERS IV SOLN: INTRAVENOUS | Status: DC | PRN

## 2022-08-05 MED ORDER — HYDROMORPHONE HCL 1 MG/ML IJ SOLN
0.2500 mg | INTRAMUSCULAR | Status: DC | PRN
  Administered 2022-08-05: 0.5 mg via INTRAVENOUS

## 2022-08-05 MED ORDER — MIDAZOLAM HCL 5 MG/5ML IJ SOLN
INTRAMUSCULAR | Status: DC | PRN
  Administered 2022-08-05: 2 mg via INTRAVENOUS

## 2022-08-05 MED ORDER — KETOROLAC TROMETHAMINE 30 MG/ML IJ SOLN: 30.0000 mg | Freq: Once | INTRAMUSCULAR | Status: DC

## 2022-08-05 MED ORDER — PROPOFOL 10 MG/ML IV BOLUS
INTRAVENOUS | Status: DC | PRN
  Administered 2022-08-05: 200 mg via INTRAVENOUS

## 2022-08-05 MED ORDER — ISOPROPYL ALCOHOL 70 % SOLN
Status: AC
  Filled 2022-08-05: qty 480

## 2022-08-05 MED ORDER — LIDOCAINE HCL 1 % IJ SOLN
INTRAMUSCULAR | Status: DC | PRN
  Administered 2022-08-05: 10 mL

## 2022-08-05 MED ORDER — SODIUM CHLORIDE 0.9 % IR SOLN
Status: DC | PRN
  Administered 2022-08-05: 3000 mL

## 2022-08-05 MED ORDER — HYDROMORPHONE HCL 1 MG/ML IJ SOLN
INTRAMUSCULAR | Status: AC
  Filled 2022-08-05: qty 1

## 2022-08-05 MED ORDER — BUPIVACAINE HCL 0.25 % IJ SOLN
INTRAMUSCULAR | Status: DC | PRN
  Administered 2022-08-05: 10 mL

## 2022-08-05 MED ORDER — 0.9 % SODIUM CHLORIDE (POUR BTL) OPTIME
TOPICAL | Status: DC | PRN
  Administered 2022-08-05: 1000 mL

## 2022-08-05 MED ORDER — ACETAMINOPHEN 10 MG/ML IV SOLN
INTRAVENOUS | Status: AC
  Filled 2022-08-05: qty 100

## 2022-08-05 MED ORDER — EPHEDRINE SULFATE-NACL 50-0.9 MG/10ML-% IV SOSY
PREFILLED_SYRINGE | INTRAVENOUS | Status: DC | PRN
  Administered 2022-08-05: 10 mg via INTRAVENOUS

## 2022-08-05 MED ORDER — LIDOCAINE 2% (20 MG/ML) 5 ML SYRINGE
INTRAMUSCULAR | Status: DC | PRN
  Administered 2022-08-05: 60 mg via INTRAVENOUS

## 2022-08-05 MED ORDER — KETOROLAC TROMETHAMINE 30 MG/ML IJ SOLN
INTRAMUSCULAR | Status: AC
  Filled 2022-08-05: qty 1

## 2022-08-05 MED ORDER — FENTANYL CITRATE (PF) 100 MCG/2ML IJ SOLN
INTRAMUSCULAR | Status: DC | PRN
  Administered 2022-08-05: 100 ug via INTRAVENOUS

## 2022-08-05 MED ORDER — LACTATED RINGERS IV SOLN: Freq: Once | INTRAVENOUS | Status: AC

## 2022-08-05 MED ORDER — FENTANYL CITRATE PF 50 MCG/ML IJ SOSY
25.0000 ug | PREFILLED_SYRINGE | INTRAMUSCULAR | Status: DC | PRN
  Administered 2022-08-05 (×2): 50 ug via INTRAVENOUS

## 2022-08-05 MED ORDER — FENTANYL CITRATE PF 50 MCG/ML IJ SOSY
PREFILLED_SYRINGE | INTRAMUSCULAR | Status: AC
  Filled 2022-08-05: qty 2

## 2022-08-05 MED ORDER — AMISULPRIDE (ANTIEMETIC) 5 MG/2ML IV SOLN: 10.0000 mg | Freq: Once | INTRAVENOUS | Status: DC | PRN

## 2022-08-05 MED ORDER — ACETAMINOPHEN 10 MG/ML IV SOLN
1000.0000 mg | Freq: Once | INTRAVENOUS | Status: AC
  Administered 2022-08-05: 1000 mg via INTRAVENOUS

## 2022-08-05 MED ORDER — MIDAZOLAM HCL 2 MG/2ML IJ SOLN
INTRAMUSCULAR | Status: AC
  Filled 2022-08-05: qty 2

## 2022-08-05 MED ORDER — BUPIVACAINE HCL 0.25 % IJ SOLN
INTRAMUSCULAR | Status: AC
  Filled 2022-08-05: qty 1

## 2022-08-05 MED ORDER — CHLORHEXIDINE GLUCONATE 0.12 % MT SOLN
15.0000 mL | Freq: Once | OROMUCOSAL | Status: AC
  Administered 2022-08-05: 15 mL via OROMUCOSAL
  Filled 2022-08-05: qty 15

## 2022-08-05 MED ORDER — FENTANYL CITRATE (PF) 100 MCG/2ML IJ SOLN
INTRAMUSCULAR | Status: AC
  Filled 2022-08-05: qty 2

## 2022-08-05 MED ORDER — LIDOCAINE HCL 1 % IJ SOLN
INTRAMUSCULAR | Status: AC
  Filled 2022-08-05: qty 20

## 2022-08-05 SURGICAL SUPPLY — 40 items
BAG COUNTER SPONGE SURGICOUNT (BAG) IMPLANT
BAG ZIPLOCK 12X15 (MISCELLANEOUS) ×1 IMPLANT
BNDG COHESIVE 1X5 TAN STRL LF (GAUZE/BANDAGES/DRESSINGS) IMPLANT
BNDG COHESIVE 4X5 TAN STRL LF (GAUZE/BANDAGES/DRESSINGS) ×1 IMPLANT
BNDG ELASTIC 2 VLCR STRL LF (GAUZE/BANDAGES/DRESSINGS) IMPLANT
BNDG ELASTIC 4INX 5YD STR LF (GAUZE/BANDAGES/DRESSINGS) IMPLANT
BNDG GAUZE DERMACEA FLUFF 4 (GAUZE/BANDAGES/DRESSINGS) ×1 IMPLANT
CNTNR URN SCR LID CUP LEK RST (MISCELLANEOUS) IMPLANT
CONT SPEC 4OZ STRL OR WHT (MISCELLANEOUS)
CORD BIPOLAR FORCEPS 12FT (ELECTRODE) ×1 IMPLANT
COVER SURGICAL LIGHT HANDLE (MISCELLANEOUS) ×1 IMPLANT
CUFF TOURN SGL QUICK 18X4 (TOURNIQUET CUFF) IMPLANT
CUFF TOURN SGL QUICK 24 (TOURNIQUET CUFF)
CUFF TRNQT CYL 24X4X16.5-23 (TOURNIQUET CUFF) IMPLANT
DRAIN PENROSE 0.25X18 (DRAIN) ×1 IMPLANT
DRAIN PENROSE 0.5X18 (DRAIN) ×1 IMPLANT
ELECT REM PT RETURN 15FT ADLT (MISCELLANEOUS) ×1 IMPLANT
GAUZE PACKING IODOFORM 1/4X15 (PACKING) ×1 IMPLANT
GAUZE PAD ABD 8X10 STRL (GAUZE/BANDAGES/DRESSINGS) ×1 IMPLANT
GAUZE SPONGE 4X4 12PLY STRL (GAUZE/BANDAGES/DRESSINGS) ×1 IMPLANT
GAUZE XEROFORM 1X8 LF (GAUZE/BANDAGES/DRESSINGS) IMPLANT
GAUZE XEROFORM 5X9 LF (GAUZE/BANDAGES/DRESSINGS) ×1 IMPLANT
GLOVE BIO SURGEON STRL SZ7.5 (GLOVE) ×2 IMPLANT
GOWN STRL REUS W/ TWL XL LVL3 (GOWN DISPOSABLE) ×1 IMPLANT
GOWN STRL REUS W/TWL XL LVL3 (GOWN DISPOSABLE) ×1
KIT BASIN OR (CUSTOM PROCEDURE TRAY) ×1 IMPLANT
KIT TURNOVER KIT A (KITS) IMPLANT
MANIFOLD NEPTUNE II (INSTRUMENTS) ×1 IMPLANT
PACK ORTHO EXTREMITY (CUSTOM PROCEDURE TRAY) ×1 IMPLANT
PAD CAST 4YDX4 CTTN HI CHSV (CAST SUPPLIES) ×1 IMPLANT
PADDING CAST COTTON 4X4 STRL (CAST SUPPLIES)
PENCIL SMOKE EVACUATOR (MISCELLANEOUS) IMPLANT
SUT ETHILON 3 0 PS 1 (SUTURE) IMPLANT
SUT ETHILON 5 0 PC 1 (SUTURE) IMPLANT
SUT PDS AB 4-0 RB1 27 (SUTURE) IMPLANT
SUT SILK 4 0 PS 2 (SUTURE) ×1 IMPLANT
SWAB COLLECTION DEVICE MRSA (MISCELLANEOUS) IMPLANT
SWAB CULTURE ESWAB REG 1ML (MISCELLANEOUS) IMPLANT
SYR 20ML LL LF (SYRINGE) ×1 IMPLANT
SYR CONTROL 10ML LL (SYRINGE) ×1 IMPLANT

## 2022-08-05 NOTE — Progress Notes (Addendum)
PROGRESS NOTE  [name] ONG:[id number] DOB: 21-Aug-1985 DOA: 08/01/2022 PCP: Collene Mares, PA  HPI/Recap of past 24 hours: Yesenia Long is a 37 y.o. female with medical history significant for anxiety who presented to the ED for evaluation of cat bite wounds. Patient states that she has an indoor cat which got loose and was missing outdoors for 8 days. Her cat returned to the house, and when patient tried to bring her cat in the home, it responded aggressively and attacked her. Patient suffered cat bites to multiple fingers of both hands as well as her left shoulder. Patient states cat was previously vaccinated but no longer up-to-date.  Her cat is currently on 10-day quarantine.  Patient went to urgent care [facility] and was prescribed Augmentin. Pt took 2 doses of the medication, but due to worsening swelling and erythema of both hands with diminished range of motion in her fingers, as well as red streaking of her left arm tracking from her hand to arm, she presented to the ED. In the ED, VSS, labs unremarkable. She was also given rabies immunoglobulin and vaccine. Hand surgeon was consulted. Patient admitted for further management.    Today, reporting diarrhea for the past 3 days, likely 2/2 antibiotics. Denies any chest pain, abdominal pain, N/V, fever/chills. Stool panel sent out for exam.    Assessment/Plan: Principal Problem:   Cat bite of multiple sites of left hand and fingers with infection Active Problems:   Cat bite of right hand including fingers with infection    Cellulitis of multiple sites of both hands/L shoulder 2/2 cat bite S/p bedside I&D by hand surgeon on 08/02/2022, noted no purulence encountered Currently afebrile, with no leukocytosis MRI of both hands with noted cellulitis, suspicions for right ring finger PIP effusion BC x 2 NGTD Post op wound cultures sent, NGTD Hand surgeon Dr. Kerry Fort recommend continuing IV antibiotics, as well as 3 times daily  antibacterial soap soaks, plans to take to the OR on 4/29 evening pending further eval RN to perform 3 times daily soap soaks Continue IV ceftriaxone and Flagyl (reported penicillin allergy, tolerating cephalosporins), oral azithromycin to cover for cat scratch disease Per CDC guidelines, patient will require 4 doses of the rabies vaccine over 14 days (day 0, 3, 7 and 14) received immunoglobulin and 1st rabies vaccine on 4/25, 2nd dose on 4/28, next doses will be on 5/2 and 5/9 to complete the series  History of anxiety Continue Lexapro    Estimated body mass index is 28.51 kg/m as calculated from the following:   Height as of this encounter: 5\' 7"  (1.702 m).   Weight as of this encounter: 82.6 kg.     Code Status: Full  Family Communication: None at bedside  Disposition Plan: Status is: Inpatient Remains inpatient appropriate because: Level of care      Consultants: Hand surgery  Procedures: Bedside I&D  Antimicrobials: Ceftriaxone, Flagyl, azithromycin  DVT prophylaxis: Lovenox   Objective: Vitals:   08/04/22 0523 08/04/22 1222 08/04/22 1931 08/05/22 0523  BP: (!) 98/55 117/73 120/78 98/76  Pulse: (!) 58 (!) 57 63 73  Resp: 15 16 20 20   Temp: 98.1 F (36.7 C) 97.6 F (36.4 C) 97.9 F (36.6 C) 97.9 F (36.6 C)  TempSrc: Oral Oral Oral Oral  SpO2: 99% 100% 99% 98%  Weight:      Height:        Intake/Output Summary (Last 24 hours) at 08/05/2022 1228 Last data filed at 08/04/2022 2300 Gross  per 24 hour  Intake 980 ml  Output --  Net 980 ml   Filed Weights   08/01/22 1134 08/01/22 1323  Weight: 82.6 kg 82.6 kg    Exam: General: NAD  Cardiovascular: S1, S2 present Respiratory: CTAB Abdomen: Soft, nontender, nondistended, bowel sounds present Musculoskeletal: No bilateral pedal edema noted, mild swelling of R hand noted with diminished ROM of the fingers Skin: Cat bite puncture wounds noted on fingers of both hands as well as some erythema and  swelling on the dorsal aspect of the hands Psychiatry: Normal mood     Data Reviewed: CBC: Recent Labs  Lab 08/01/22 1426 08/02/22 0037 08/03/22 0652 08/05/22 0556  WBC 9.6 7.7 4.1 5.2  NEUTROABS 7.4  --  2.0  --   HGB 14.7 13.0 12.3 13.0  HCT 42.7 38.3 38.0 40.4  MCV 91.8 94.8 98.7 98.1  PLT 249 216 151 188   Basic Metabolic Panel: Recent Labs  Lab 08/01/22 1426 08/02/22 0037 08/03/22 0652  NA 136 136 136  K 4.9 3.6 4.2  CL 104 103 107  CO2 25 24 21*  GLUCOSE 82 92 81  BUN 12 14 12   CREATININE 0.63 0.66 0.55  CALCIUM 9.3 8.4* 8.1*   GFR: Estimated Creatinine Clearance: 104.3 mL/min (by C-G formula based on SCr of 0.55 mg/dL). Liver Function Tests: Recent Labs  Lab 08/01/22 1426  AST 27  ALT 17  ALKPHOS 45  BILITOT 0.5  PROT 7.2  ALBUMIN 4.7   No results for input(s): "LIPASE", "AMYLASE" in the last 168 hours. No results for input(s): "AMMONIA" in the last 168 hours. Coagulation Profile: No results for input(s): "INR", "PROTIME" in the last 168 hours. Cardiac Enzymes: No results for input(s): "CKTOTAL", "CKMB", "CKMBINDEX", "TROPONINI" in the last 168 hours. BNP (last 3 results) No results for input(s): "PROBNP" in the last 8760 hours. HbA1C: No results for input(s): "HGBA1C" in the last 72 hours. CBG: No results for input(s): "GLUCAP" in the last 168 hours. Lipid Profile: No results for input(s): "CHOL", "HDL", "LDLCALC", "TRIG", "CHOLHDL", "LDLDIRECT" in the last 72 hours. Thyroid Function Tests: No results for input(s): "TSH", "T4TOTAL", "FREET4", "T3FREE", "THYROIDAB" in the last 72 hours. Anemia Panel: No results for input(s): "VITAMINB12", "FOLATE", "FERRITIN", "TIBC", "IRON", "RETICCTPCT" in the last 72 hours. Urine analysis: No results found for: "COLORURINE", "APPEARANCEUR", "LABSPEC", "PHURINE", "GLUCOSEU", "HGBUR", "BILIRUBINUR", "KETONESUR", "PROTEINUR", "UROBILINOGEN", "NITRITE", "LEUKOCYTESUR" Sepsis  Labs: @LABRCNTIP (procalcitonin:4,lacticidven:4)  ) Recent Results (from the past 240 hour(s))  Blood culture (routine x 2)     Status: None (Preliminary result)   Collection Time: 08/01/22  2:26 PM   Specimen: BLOOD LEFT FOREARM  Result Value Ref Range Status   Specimen Description   Final    BLOOD LEFT FOREARM Performed at [facility], [address], [place], [place] [address]    Special Requests   Final    BOTTLES DRAWN AEROBIC AND ANAEROBIC Blood Culture adequate volume Performed at [facility], [address] [place], [place] [address]    Culture   Final    NO GROWTH 4 DAYS Performed at [facility], [address] [place], [place] [address]    Report Status PENDING  Incomplete  Blood culture (routine x 2)     Status: None (Preliminary result)   Collection Time: 08/01/22  2:26 PM   Specimen: BLOOD  Result Value Ref Range Status   Specimen Description   Final    BLOOD RIGHT ANTECUBITAL Performed at [facility], [address], [place], [place] [address]    Special Requests   Final    BOTTLES DRAWN AEROBIC AND ANAEROBIC Blood Culture results may not be optimal due to an excessive volume of blood received in culture bottles Performed at [facility], [address], [place], [place] [address]    Culture   Final    NO GROWTH 4 DAYS Performed at [facility], [address] [place], [place] [address]    Report Status PENDING  Incomplete  Aerobic Culture w Gram Stain (superficial specimen)     Status: None   Collection Time: 08/02/22 12:43 AM   Specimen: Abscess  Result Value Ref Range Status   Specimen Description   Final    ABSCESS RT HAND Performed at [facility], [address], [place], [place] [address]    Special Requests   Final    NONE Performed at [facility], [address], [place], [place] [address]    Gram Stain NO WBC SEEN NO  ORGANISMS SEEN   Final   Culture   Final    NO GROWTH 2 DAYS Performed at [facility], [address] [place], [place] [address]    Report Status 08/05/2022 FINAL  Final      Studies: No results found.  Scheduled Meds:  azithromycin  250 mg Oral Daily   enoxaparin (LOVENOX) injection  40 mg Subcutaneous Q24H   escitalopram  10 mg Oral Daily    Continuous Infusions:  cefTRIAXone (ROCEPHIN)  IV 2 g (08/05/22 0817)   metronidazole 500 mg (08/05/22 0817)     LOS: 4 days     Briant Cedar, MD [facility]  If 7PM-7AM, please contact night-coverage [email or web address] 08/05/2022, 12:28 PM

## 2022-08-05 NOTE — Progress Notes (Signed)
Pacu RN Report to floor given  Gave report to   [facility]. Room: 1515   Discussed surgery, meds given in OR and Pacu, VS, IV fluids given, EBL, urine output, pain and other pertinent information. Also discussed if pt had any family or friends here or belongings with them.   Pt has a bulky dsg, able to palpate strong radial pulse bilaterally. Pain med given, pt was 10/10 on arrival to [facility], now 3/10. Pain is mostly throbbing sensation. A pain pill will help with her pain better once she gets to her room.  Pt has hand elevated on ice pack, +CSM to R fingers that can be accessed from the bandage, +3 cap refill. Pt tolerated sip of water.    Pt exits my care.

## 2022-08-05 NOTE — Anesthesia Preprocedure Evaluation (Signed)
Anesthesia Evaluation  Patient identified by MRN, date of birth, ID band Patient awake    Reviewed: Allergy & Precautions, NPO status , Patient's Chart, lab work & pertinent test results  Airway Mallampati: II  TM Distance: >3 FB Neck ROM: Full    Dental  (+) Dental Advisory Given   Pulmonary neg pulmonary ROS   breath sounds clear to auscultation       Cardiovascular negative cardio ROS  Rhythm:Regular Rate:Normal     Neuro/Psych negative neurological ROS     GI/Hepatic negative GI ROS, Neg liver ROS,,,  Endo/Other  negative endocrine ROS    Renal/GU negative Renal ROS     Musculoskeletal   Abdominal   Peds  Hematology negative hematology ROS (+)   Anesthesia Other Findings   Reproductive/Obstetrics                             Anesthesia Physical Anesthesia Plan  ASA: 2  Anesthesia Plan: General   Post-op Pain Management: Tylenol PO (pre-op)* and Toradol IV (intra-op)*   Induction: Intravenous  PONV Risk Score and Plan: 3 and Dexamethasone, Ondansetron and Treatment may vary due to age or medical condition  Airway Management Planned: LMA  Additional Equipment:   Intra-op Plan:   Post-operative Plan: Extubation in OR  Informed Consent: I have reviewed the patients History and Physical, chart, labs and discussed the procedure including the risks, benefits and alternatives for the proposed anesthesia with the patient or authorized representative who has indicated his/her understanding and acceptance.     Dental advisory given  Plan Discussed with: CRNA  Anesthesia Plan Comments:        Anesthesia Quick Evaluation

## 2022-08-05 NOTE — Anesthesia Procedure Notes (Signed)
Procedure Name: LMA Insertion Date/Time: 08/05/2022 6:38 PM  Performed by: Kizzie Fantasia, CRNAPre-anesthesia Checklist: Patient identified, Emergency Drugs available, Suction available, Patient being monitored and Timeout performed Patient Re-evaluated:Patient Re-evaluated prior to induction Oxygen Delivery Method: Circle system utilized Preoxygenation: Pre-oxygenation with 100% oxygen Induction Type: IV induction Ventilation: Mask ventilation without difficulty LMA Size: 4.0 Number of attempts: 1 Placement Confirmation: positive ETCO2 and breath sounds checked- equal and bilateral Tube secured with: Tape Dental Injury: Teeth and Oropharynx as per pre-operative assessment

## 2022-08-05 NOTE — Transfer of Care (Signed)
Immediate Anesthesia Transfer of Care Note  Patient: Yesenia Long  Procedure(s) Performed: IRRIGATION AND DEBRIDEMENT WOUND (Right)  Patient Location: PACU  Anesthesia Type:General  Level of Consciousness: sedated, patient cooperative, and responds to stimulation  Airway & Oxygen Therapy: Patient Spontanous Breathing and Patient connected to face mask oxygen  Post-op Assessment: Report given to RN and Post -op Vital signs reviewed and stable  Post vital signs: Reviewed and stable  Last Vitals:  Vitals Value Taken Time  BP    Temp    Pulse    Resp    SpO2      Last Pain:  Vitals:   08/05/22 1823  TempSrc:   PainSc: 5       Patients Stated Pain Goal: 0 (08/05/22 0523)  Complications: No notable events documented.

## 2022-08-05 NOTE — Op Note (Signed)
NAME: Yesenia Long MEDICAL RECORD NO: [id number] DATE OF BIRTH: February 12, 1986 FACILITY: [facility] LOCATION: [facility] PHYSICIAN: Ramon Dredge MD   OPERATIVE REPORT   DATE OF PROCEDURE: 08/05/22    PREOPERATIVE DIAGNOSIS:  Right ring and long finger infections with concern for septic arthritis of the PIP joints   POSTOPERATIVE DIAGNOSIS:  same   PROCEDURE:  Right ring finger and right long finger PIP joint irrigation and debridement.   SURGEON: Edsel Petrin. Rivan Siordia, M.D.   ASSISTANT: none   ANESTHESIA:  General   INTRAVENOUS FLUIDS:  Per anesthesia flow sheet.   ESTIMATED BLOOD LOSS:  Minimal.   COMPLICATIONS:  None.   SPECIMENS:  2 cultures from right ring finger PIP joint and 2 cultures from right long finger PIP joint   TOURNIQUET TIME:    Total Tourniquet Time Documented: Upper Arm (Right) - 57 minutes Total: Upper Arm (Right) - 57 minutes    DISPOSITION:  Stable to PACU.   INDICATIONS:  37 year old who sustained multiple cat bites on both hands on 08/01/2022 and underwent bedside I&D of the right ring finger PIP joint on 08/02/2022 who had continued tenderness and inability to flex the PIP joint of the ring finger and limited range of motion of the PIP of the long finger despite IV antibiotics therefore decision was made to washout the ring PIP joint again and the long PIP joint as well.  OPERATIVE COURSE: Patient was identified in holding and the surgical site, side, and procedure was confirmed.  The risks and benefits as well as the alternatives of the procedure was discussed and the patient signed a witness consent form.  She wished to proceed with surgery.  She was taken back to the operating room and transferred to the operating room table.  She was placed under general anesthesia by the anesthesia team.  A tourniquet was placed on the right forearm.  The right arm was outstretched on a hand table.  All bony prominences were padded.  She was prepped and  draped in typical sterile fashion with DuraPrep.  After gravity exsanguination the tourniquet was put up to 250 mmHg.  The prior mid axial incision on the ring finger was extended both proximally and distally by approximately 2 cm.  The soft tissue was bluntly dissected with tenotomy's and pickups down to the joint capsule.  The neurovascular bundle was identified and retracted.  The joint capsule was then incised through the prior incision from the first I&D.  No purulent material was present.  Cultures were taken at this time.  The joint was then washed out thoroughly with normal saline.  Attention was then turned to the long finger where again a mid axial incision was made on the radial border of the digit.  The subcutaneous soft tissue was bluntly dissected with tenotomy's and pickups down to the joint capsule again an incision was made into the joint capsule through the accessory collateral ligament.  No purulent material was encountered.  Cultures were taken of the joint.  Both joints were then washed out thoroughly with total of 3 L of normal saline.  The joint capsules were closed with 4-0 PDS suture.  Hemostasis was achieved with bipolar electrocautery.  The skin was then loosely closed with 5-0 nylon.  Postoperative dressings included Xeroform, Kling wrap, gauze, Kerlix creating a bulky dressing and a loosely wrapped Ace bandage.  Postoperative plan: She will remain admitted and on IV antibiotics until cultures return.  We will hold off on  restarting antibacterial soap soaks until 24 hours after surgery.  Will have her start gentle finger range of motion tomorrow.  Ramon Dredge, MD Electronically signed, 08/05/22

## 2022-08-05 NOTE — Plan of Care (Signed)

## 2022-08-05 NOTE — H&P (Signed)
History and Physical Interval Note:   08/05/22 6:19 PM   Yesenia Long was re-evaluated today and continues to have pain and decreased range of motion of the right ring finger despite bedside I&D and IV abx.  Therefore the decision was made to take her to the OR for repeat wash out of the ring finger PIP joint.  She has improved erythema and ROM in the right long finger but LF PIP ROM approximately 60 deg with pain and still very tender to palpation so will perform I&D of this joint as well for right hand infections from cat bite.  The various methods of treatment have been discussed with the patient and family. After consideration of risks, benefits and other options for treatment, the patient has consented to  Procedure(s):  Procedure(s): IRRIGATION AND DEBRIDEMENT WOUND as a surgical intervention.  The patient's history has been reviewed, patient examined, no change in status, stable for surgery.  I have reviewed the patient's chart and labs.  Questions were answered to the patient's satisfaction.       Tasnia Spegal M Avraj Lindroth

## 2022-08-06 ENCOUNTER — Encounter (HOSPITAL_COMMUNITY): Payer: Self-pay | Admitting: Orthopedic Surgery

## 2022-08-06 DIAGNOSIS — L089 Local infection of the skin and subcutaneous tissue, unspecified: Secondary | ICD-10-CM | POA: Diagnosis not present

## 2022-08-06 DIAGNOSIS — W5501XA Bitten by cat, initial encounter: Secondary | ICD-10-CM | POA: Diagnosis not present

## 2022-08-06 DIAGNOSIS — S61259A Open bite of unspecified finger without damage to nail, initial encounter: Secondary | ICD-10-CM | POA: Diagnosis not present

## 2022-08-06 DIAGNOSIS — S61452A Open bite of left hand, initial encounter: Secondary | ICD-10-CM | POA: Diagnosis not present

## 2022-08-06 LAB — BASIC METABOLIC PANEL
Anion gap: 9 (ref 5–15)
BUN: 15 mg/dL (ref 6–20)
CO2: 23 mmol/L (ref 22–32)
Calcium: 8.4 mg/dL — ABNORMAL LOW (ref 8.9–10.3)
Chloride: 102 mmol/L (ref 98–111)
Creatinine, Ser: 0.81 mg/dL (ref 0.44–1.00)
GFR, Estimated: >60 mL/min (ref >60)
Glucose, Bld: 151 mg/dL — ABNORMAL HIGH (ref 70–99)
Potassium: 4.3 mmol/L (ref 3.5–5.1)
Sodium: 134 mmol/L — ABNORMAL LOW (ref 135–145)

## 2022-08-06 LAB — CBC
HCT: 42.7 % (ref 36.0–46.0)
Hemoglobin: 14 g/dL (ref 12.0–15.0)
MCH: 30.7 pg (ref 26.0–34.0)
MCHC: 32.8 g/dL (ref 30.0–36.0)
MCV: 93.6 fL (ref 80.0–100.0)
Platelets: 264 10*3/uL (ref 150–400)
RBC: 4.56 MIL/uL (ref 3.87–5.11)
RDW: 12.2 % (ref 11.5–15.5)
WBC: 8.1 10*3/uL (ref 4.0–10.5)
nRBC: 0 % (ref 0.0–0.2)

## 2022-08-06 LAB — CULTURE, BLOOD (ROUTINE X 2): Special Requests: ADEQUATE

## 2022-08-06 LAB — AEROBIC/ANAEROBIC CULTURE W GRAM STAIN (SURGICAL/DEEP WOUND)

## 2022-08-06 MED ORDER — HYDROMORPHONE HCL 1 MG/ML IJ SOLN
0.5000 mg | INTRAMUSCULAR | Status: AC | PRN
  Administered 2022-08-06 – 2022-08-07 (×3): 0.5 mg via INTRAVENOUS
  Filled 2022-08-06 (×3): qty 0.5

## 2022-08-06 NOTE — Progress Notes (Signed)
Patient seen and examined earlier today and was doing well. Reports she has some post op pain but thinks she is doing better than before surgery. Cultures pending  Bandages removed and wounds examined. No drainage at this time. Pip joints less tender today than before surgery. Still painful with ROM.   Wounds re-wrapped. Plan to re-start antibiotic soap soaks tomorrow morning. Continue abx pending culture results.   Al exander Keslee Harrington,MD  [phone]

## 2022-08-06 NOTE — Progress Notes (Signed)
PROGRESS NOTE  Yesenia Long JWJ:[id number] DOB: 04/06/1986 DOA: 08/01/2022 PCP: Collene Mares, PA  HPI/Recap of past 24 hours: Yesenia Long is a 37 y.o. female with medical history significant for anxiety who presented to the ED for evaluation of cat bite wounds. Patient states that she has an indoor cat which got loose and was missing outdoors for 8 days. Her cat returned to the house, and when patient tried to bring her cat in the home, it responded aggressively and attacked her. Patient suffered cat bites to multiple fingers of both hands as well as her left shoulder. Patient states cat was previously vaccinated but no longer up-to-date.  Her cat is currently on 10-day quarantine.  Patient went to urgent care [facility]TA and was prescribed Augmentin. Pt took 2 doses of the medication, but due to worsening swelling and erythema of both hands with diminished range of motion in her fingers, as well as red streaking of her left arm tracking from her hand to arm, she presented to the ED. In the ED, VSS, labs unremarkable. She was also given rabies immunoglobulin and vaccine. Hand surgeon was consulted. Patient admitted for further management.    Today, patient denies any new complaints, just right upper extremity pain after I&D.  Reports diarrhea is slowly improving.     Assessment/Plan: Principal Problem:   Cat bite of multiple sites of left hand and fingers with infection Active Problems:   Cat bite of right hand including fingers with infection    Cellulitis of multiple sites of both hands/L shoulder 2/2 cat bite S/p bedside I&D by hand surgeon on 08/02/2022, noted no purulence encountered S/p I&D in OR on 4/29 Currently afebrile, with no leukocytosis MRI of both hands with noted cellulitis, suspicions for right ring finger PIP effusion BC x 2 NGTD Initial post op wound cultures sent, NGTD, repeat I&D cultures pending Hand surgeon Dr. Kerry Fort on board  Continue IV ceftriaxone and  Flagyl (reported penicillin allergy, tolerating cephalosporins), oral azithromycin to cover for cat scratch disease Further management pending hand surgery Per CDC guidelines, patient will require 4 doses of the rabies vaccine over 14 days (day 0, 3, 7 and 14) received immunoglobulin and 1st rabies vaccine on 4/25, 2nd dose on 4/28, next doses will be on 5/2 and 5/9 to complete the series  History of anxiety Continue Lexapro    Estimated body mass index is 28.52 kg/m as calculated from the following:   Height as of this encounter: 5\' 7"  (1.702 m).   Weight as of this encounter: 82.6 kg.     Code Status: Full  Family Communication: None at bedside  Disposition Plan: Status is: Inpatient Remains inpatient appropriate because: Level of care      Consultants: Hand surgery  Procedures: Bedside I&D I&D in the OR  Antimicrobials: Ceftriaxone, Flagyl, azithromycin  DVT prophylaxis: Lovenox   Objective: Vitals:   08/05/22 2045 08/05/22 2050 08/05/22 2101 08/06/22 0509  BP:   104/73 (!) 102/58  Pulse: 63 72 67 69  Resp: 16 15 16 16   Temp:  (!) 97.3 F (36.3 C) 98 F (36.7 C) 97.6 F (36.4 C)  TempSrc:   Oral   SpO2: 100% 98% 100% 95%  Weight:      Height:        Intake/Output Summary (Last 24 hours) at 08/06/2022 1345 Last data filed at 08/05/2022 1955 Gross per 24 hour  Intake 900 ml  Output 25 ml  Net 875 ml   [employer]  08/01/22 1134 08/01/22 1323 08/05/22 1823  Weight: 82.6 kg 82.6 kg 82.6 kg    Exam: General: NAD  Cardiovascular: S1, S2 present Respiratory: CTAB Abdomen: Soft, nontender, nondistended, bowel sounds present Musculoskeletal: No bilateral pedal edema noted, R hand noted with Ace wrap  Skin: Cat bite puncture wounds noted on fingers of both hands  Psychiatry: Normal mood     Data Reviewed: CBC: Recent Labs  Lab 08/01/22 1426 08/02/22 0037 08/03/22 0652 08/05/22 0556 08/06/22 0555  WBC 9.6 7.7 4.1 5.2 8.1  NEUTROABS 7.4   --  2.0  --   --   HGB 14.7 13.0 12.3 13.0 14.0  HCT 42.7 38.3 38.0 40.4 42.7  MCV 91.8 94.8 98.7 98.1 93.6  PLT 249 216 151 188 264   Basic Metabolic Panel: Recent Labs  Lab 08/01/22 1426 08/02/22 0037 08/03/22 0652 08/06/22 0555  NA 136 136 136 134*  K 4.9 3.6 4.2 4.3  CL 104 103 107 102  CO2 25 24 21* 23  GLUCOSE 82 92 81 151*  BUN 12 14 12 15   CREATININE 0.63 0.66 0.55 0.81  CALCIUM 9.3 8.4* 8.1* 8.4*   GFR: Estimated Creatinine Clearance: 103 mL/min (by C-G formula based on SCr of 0.81 mg/dL). Liver Function Tests: Recent Labs  Lab 08/01/22 1426  AST 27  ALT 17  ALKPHOS 45  BILITOT 0.5  PROT 7.2  ALBUMIN 4.7   No results for input(s): "LIPASE", "AMYLASE" in the last 168 hours. No results for input(s): "AMMONIA" in the last 168 hours. Coagulation Profile: No results for input(s): "INR", "PROTIME" in the last 168 hours. Cardiac Enzymes: No results for input(s): "CKTOTAL", "CKMB", "CKMBINDEX", "TROPONINI" in the last 168 hours. BNP (last 3 results) No results for input(s): "PROBNP" in the last 8760 hours. HbA1C: No results for input(s): "HGBA1C" in the last 72 hours. CBG: No results for input(s): "GLUCAP" in the last 168 hours. Lipid Profile: No results for input(s): "CHOL", "HDL", "LDLCALC", "TRIG", "CHOLHDL", "LDLDIRECT" in the last 72 hours. Thyroid Function Tests: No results for input(s): "TSH", "T4TOTAL", "FREET4", "T3FREE", "THYROIDAB" in the last 72 hours. Anemia Panel: No results for input(s): "VITAMINB12", "FOLATE", "FERRITIN", "TIBC", "IRON", "RETICCTPCT" in the last 72 hours. Urine analysis: No results found for: "COLORURINE", "APPEARANCEUR", "LABSPEC", "PHURINE", "GLUCOSEU", "HGBUR", "BILIRUBINUR", "KETONESUR", "PROTEINUR", "UROBILINOGEN", "NITRITE", "LEUKOCYTESUR" Sepsis Labs: @LABRCNTIP (procalcitonin:4,lacticidven:4)  ) Recent Results (from the past 240 hour(s))  Blood culture (routine x 2)     Status: None   Collection Time: 08/01/22   2:26 PM   Specimen: BLOOD LEFT FOREARM  Result Value Ref Range Status   Specimen Description   Final    BLOOD LEFT FOREARM Performed at [facility], [address] [place], [place] [address]    Special Requests   Final    BOTTLES DRAWN AEROBIC AND ANAEROBIC Blood Culture adequate volume Performed at [facility], [address], [place], [place] [address]    Culture   Final    NO GROWTH 5 DAYS Performed at [facility], [address], [place], [place] [address]    Report Status 08/06/2022 FINAL  Final  Blood culture (routine x 2)     Status: None   Collection Time: 08/01/22  2:26 PM   Specimen: BLOOD  Result Value Ref Range Status   Specimen Description   Final    BLOOD RIGHT ANTECUBITAL Performed at [facility], [address] [place], [place] [address]    Special Requests   Final  BOTTLES DRAWN AEROBIC AND ANAEROBIC Blood Culture results may not be optimal due to an excessive volume of blood received in culture bottles Performed at [facility], [address], [place], [place] [address]    Culture   Final    NO GROWTH 5 DAYS Performed at [facility], [address] [place], [place] [address]    Report Status 08/06/2022 FINAL  Final  Aerobic Culture w Gram Stain (superficial specimen)     Status: None   Collection Time: 08/02/22 12:43 AM   Specimen: Abscess  Result Value Ref Range Status   Specimen Description   Final    ABSCESS RT HAND Performed at [facility], [address], [place], [place] [address]    Special Requests   Final    NONE Performed at [facility], [address], [place], [place] [address]    Gram Stain NO WBC SEEN NO ORGANISMS SEEN   Final   Culture   Final    NO GROWTH 2 DAYS Performed at [facility], [address], [place], [place] [address]    Report Status 08/05/2022 FINAL  Final  Gastrointestinal Panel by  PCR , Stool     Status: None   Collection Time: 08/05/22  9:27 AM   Specimen: Stool  Result Value Ref Range Status   Campylobacter species NOT DETECTED NOT DETECTED Final   Plesimonas shigelloides NOT DETECTED NOT DETECTED Final   Salmonella species NOT DETECTED NOT DETECTED Final   Yersinia enterocolitica NOT DETECTED NOT DETECTED Final   Vibrio species NOT DETECTED NOT DETECTED Final   Vibrio cholerae NOT DETECTED NOT DETECTED Final   Enteroaggregative E coli (EAEC) NOT DETECTED NOT DETECTED Final   Enteropathogenic E coli (EPEC) NOT DETECTED NOT DETECTED Final   Enterotoxigenic E coli (ETEC) NOT DETECTED NOT DETECTED Final   Shiga like toxin producing E coli (STEC) NOT DETECTED NOT DETECTED Final   Shigella/Enteroinvasive E coli (EIEC) NOT DETECTED NOT DETECTED Final   Cryptosporidium NOT DETECTED NOT DETECTED Final   Cyclospora cayetanensis NOT DETECTED NOT DETECTED Final   Entamoeba histolytica NOT DETECTED NOT DETECTED Final   Giardia lamblia NOT DETECTED NOT DETECTED Final   Adenovirus F40/41 NOT DETECTED NOT DETECTED Final   Astrovirus NOT DETECTED NOT DETECTED Final   Norovirus GI/GII NOT DETECTED NOT DETECTED Final   Rotavirus A NOT DETECTED NOT DETECTED Final   Sapovirus (I, II, IV, and V) NOT DETECTED NOT DETECTED Final    Comment: Performed at [facility], [address] [place], [place] [address]  Aerobic/Anaerobic Culture w Gram Stain (surgical/deep wound)     Status: None (Preliminary result)   Collection Time: 08/05/22  6:58 PM   Specimen: Wound  Result Value Ref Range Status   Specimen Description   Final    WOUND RIGHT RING FINGER PIP JOINT CULTURE Performed at [facility], [address] [place], [place] [address]    Special Requests   Final    NONE Performed at [facility], [address], [place], [place] [address]    Gram Stain   Final    RARE WBC PRESENT,BOTH PMN AND MONONUCLEAR NO ORGANISMS  SEEN Performed at [facility], [address] [place], [place] [address]    Culture PENDING  Incomplete   Report Status PENDING  Incomplete  Aerobic/Anaerobic Culture w Gram Stain (surgical/deep wound)     Status: None (Preliminary result)  Collection Time: 08/05/22  7:15 PM   Specimen: Wound  Result Value Ref Range Status   Specimen Description   Final    WOUND RIGHT RING FINGER PIP JOINT Performed at [facility], [address], [place], [place] [address]    Special Requests   Final    NONE Performed at [facility], [address], [place], [place] [address]    Gram Stain   Final    NO WBC SEEN NO ORGANISMS SEEN Performed at [facility], [address], [place], [place] [address]    Culture PENDING  Incomplete   Report Status PENDING  Incomplete      Studies: No results found.  Scheduled Meds:  enoxaparin (LOVENOX) injection  40 mg Subcutaneous Q24H   escitalopram  10 mg Oral Daily    Continuous Infusions:  cefTRIAXone (ROCEPHIN)  IV 2 g (08/06/22 0830)   metronidazole 500 mg (08/06/22 0936)     LOS: 5 days     Briant Cedar, MD [facility]  If 7PM-7AM, please contact night-coverage [email or web address] 08/06/2022, 1:45 PM

## 2022-08-07 DIAGNOSIS — W5501XA Bitten by cat, initial encounter: Secondary | ICD-10-CM | POA: Diagnosis not present

## 2022-08-07 DIAGNOSIS — S61452A Open bite of left hand, initial encounter: Secondary | ICD-10-CM | POA: Diagnosis not present

## 2022-08-07 DIAGNOSIS — S61259A Open bite of unspecified finger without damage to nail, initial encounter: Secondary | ICD-10-CM | POA: Diagnosis not present

## 2022-08-07 DIAGNOSIS — L089 Local infection of the skin and subcutaneous tissue, unspecified: Secondary | ICD-10-CM | POA: Diagnosis not present

## 2022-08-07 LAB — BASIC METABOLIC PANEL
Anion gap: 6 (ref 5–15)
BUN: 15 mg/dL (ref 6–20)
CO2: 25 mmol/L (ref 22–32)
Calcium: 8.3 mg/dL — ABNORMAL LOW (ref 8.9–10.3)
Chloride: 106 mmol/L (ref 98–111)
Creatinine, Ser: 0.68 mg/dL (ref 0.44–1.00)
GFR, Estimated: >60 mL/min (ref >60)
Glucose, Bld: 83 mg/dL (ref 70–99)
Potassium: 4 mmol/L (ref 3.5–5.1)
Sodium: 137 mmol/L (ref 135–145)

## 2022-08-07 LAB — CBC
HCT: 38.3 % (ref 36.0–46.0)
Hemoglobin: 12.4 g/dL (ref 12.0–15.0)
MCH: 31 pg (ref 26.0–34.0)
MCHC: 32.4 g/dL (ref 30.0–36.0)
MCV: 95.8 fL (ref 80.0–100.0)
Platelets: 213 10*3/uL (ref 150–400)
RBC: 4 MIL/uL (ref 3.87–5.11)
RDW: 12.7 % (ref 11.5–15.5)
WBC: 7 10*3/uL (ref 4.0–10.5)
nRBC: 0 % (ref 0.0–0.2)

## 2022-08-07 LAB — ACID FAST SMEAR (AFB, MYCOBACTERIA)
Acid Fast Smear: NEGATIVE
Acid Fast Smear: NEGATIVE

## 2022-08-07 MED ORDER — OXYCODONE HCL 5 MG PO TABS
5.0000 mg | ORAL_TABLET | ORAL | Status: DC | PRN
  Administered 2022-08-07 – 2022-08-13 (×32): 5 mg via ORAL
  Filled 2022-08-07 (×32): qty 1

## 2022-08-07 NOTE — Progress Notes (Signed)
[facility] PROGRESS NOTE    Progress Note  Yesenia Long  ZOX:[id number] DOB: [birth year] DOA: 08/01/2022 PCP: Collene Mares, PA     Brief Narrative:   [name] is an 37 y.o. female past medical history significant for anxiety presents to the ED for evaluation of a cat bite   Assessment/Plan:   Cellulitis of multiple sites both hands left shoulder secondary to  Cat bite of multiple sites of left hand and fingers with infection status post I&D by hand surgeon: On 08/02/2022 no purulence/status post I&D in the OR on 08/05/2022 Started initially on Rocephin and Flagyl and oral azithromycin on admission to cover for scratch disease. MRI of both hands was noted for cellulitis suspicious for right ring finger PIP effusion. Blood cultures have been negative to 2. 4 doses of the rabies vaccine over 14 days (day 0, 3, 7 and 14) received immunoglobulin and 1st rabies vaccine on 4/25, 2nd dose on 4/28, next doses will be on 5/2 and 5/9 to complete the series  Awaiting cultures to tailor antibiotics.  History of anxiety: Continue Lexapro.   DVT prophylaxis: lovenox Family Communication:none Status is: Inpatient Remains inpatient appropriate because: Cellulitis of multiple sites    Code Status:     Code Status Orders  (From admission, onward)           Start     Ordered   08/01/22 1908  Full code  Continuous       Question:  By:  Answer:  Consent: discussion documented in EHR   08/01/22 1908           Code Status History     This patient has a current code status but no historical code status.         IV Access:   Peripheral IV   Procedures and diagnostic studies:   No results found.   Medical Consultants:   None.   Subjective:    Shealyn Kyer relates her pain is better bowel movements have slowed down  Objective:    Vitals:   08/06/22 0509 08/06/22 1413 08/06/22 2123 08/07/22 0626  BP: (!) 102/58 122/71 109/74 97/67   Pulse: 69 62 65 62  Resp: 16 19 14 14   Temp: 97.6 F (36.4 C) 98.3 F (36.8 C) 97.6 F (36.4 C) 97.7 F (36.5 C)  TempSrc:  Oral Oral Oral  SpO2: 95% 100% 100% 98%  Weight:      Height:       SpO2: 98 %   Intake/Output Summary (Last 24 hours) at 08/07/2022 1128 Last data filed at 08/06/2022 2039 Gross per 24 hour  Intake 480 ml  Output --  Net 480 ml   Filed Weights   08/01/22 1134 08/01/22 1323 08/05/22 1823  Weight: 82.6 kg 82.6 kg 82.6 kg    Exam: General exam: In no acute distress. Respiratory system: Good air movement and clear to auscultation. Cardiovascular system: S1 & S2 heard, RRR. No JVD. Gastrointestinal system: Abdomen is nondistended, soft and nontender.  Extremities: No pedal edema. Skin: No rashes, lesions or ulcers Psychiatry: Judgement and insight appear normal. Mood & affect appropriate.    Data Reviewed:    Labs: Basic Metabolic Panel: Recent Labs  Lab 08/01/22 1426 08/02/22 0037 08/03/22 0652 08/06/22 0555 08/07/22 0537  NA 136 136 136 134* 137  K 4.9 3.6 4.2 4.3 4.0  CL 104 103 107 102 106  CO2 25 24 21* 23 25  GLUCOSE 82 92 81 151* 83  BUN 12 14 12 15 15   CREATININE 0.63 0.66 0.55 0.81 0.68  CALCIUM 9.3 8.4* 8.1* 8.4* 8.3*   GFR Estimated Creatinine Clearance: 104.3 mL/min (by C-G formula based on SCr of 0.68 mg/dL). Liver Function Tests: Recent Labs  Lab 08/01/22 1426  AST 27  ALT 17  ALKPHOS 45  BILITOT 0.5  PROT 7.2  ALBUMIN 4.7   No results for input(s): "LIPASE", "AMYLASE" in the last 168 hours. No results for input(s): "AMMONIA" in the last 168 hours. Coagulation profile No results for input(s): "INR", "PROTIME" in the last 168 hours. COVID-19 Labs  No results for input(s): "DDIMER", "FERRITIN", "LDH", "CRP" in the last 72 hours.  No results found for: "SARSCOV2NAA"  CBC: Recent Labs  Lab 08/01/22 1426 08/02/22 0037 08/03/22 0652 08/05/22 0556 08/06/22 0555 08/07/22 0537  WBC 9.6 7.7 4.1 5.2 8.1 7.0   NEUTROABS 7.4  --  2.0  --   --   --   HGB 14.7 13.0 12.3 13.0 14.0 12.4  HCT 42.7 38.3 38.0 40.4 42.7 38.3  MCV 91.8 94.8 98.7 98.1 93.6 95.8  PLT 249 216 151 188 264 213   Cardiac Enzymes: No results for input(s): "CKTOTAL", "CKMB", "CKMBINDEX", "TROPONINI" in the last 168 hours. BNP (last 3 results) No results for input(s): "PROBNP" in the last 8760 hours. CBG: No results for input(s): "GLUCAP" in the last 168 hours. D-Dimer: No results for input(s): "DDIMER" in the last 72 hours. Hgb A1c: No results for input(s): "HGBA1C" in the last 72 hours. Lipid Profile: No results for input(s): "CHOL", "HDL", "LDLCALC", "TRIG", "CHOLHDL", "LDLDIRECT" in the last 72 hours. Thyroid function studies: No results for input(s): "TSH", "T4TOTAL", "T3FREE", "THYROIDAB" in the last 72 hours.  Invalid input(s): "FREET3" Anemia work up: No results for input(s): "VITAMINB12", "FOLATE", "FERRITIN", "TIBC", "IRON", "RETICCTPCT" in the last 72 hours. Sepsis Labs: Recent Labs  Lab 08/03/22 0652 08/05/22 0556 08/06/22 0555 08/07/22 0537  WBC 4.1 5.2 8.1 7.0   Microbiology Recent Results (from the past 240 hour(s))  Blood culture (routine x 2)     Status: None   Collection Time: 08/01/22  2:26 PM   Specimen: BLOOD LEFT FOREARM  Result Value Ref Range Status   Specimen Description   Final    BLOOD LEFT FOREARM Performed at [facility], [address] [place], [place] [address]    Special Requests   Final    BOTTLES DRAWN AEROBIC AND ANAEROBIC Blood Culture adequate volume Performed at [facility], [address] [place], [place] [address]    Culture   Final    NO GROWTH 5 DAYS Performed at [facility], [address] [place], [place] [address]    Report Status 08/06/2022 FINAL  Final  Blood culture (routine x 2)     Status: None   Collection Time: 08/01/22  2:26 PM   Specimen: BLOOD  Result Value Ref Range Status   Specimen Description   Final     BLOOD RIGHT ANTECUBITAL Performed at [facility], [address], [place], [place] [address]    Special Requests   Final    BOTTLES DRAWN AEROBIC AND ANAEROBIC Blood Culture results may not be optimal due to an excessive volume of blood received in culture bottles Performed at [facility], [address], [place], [place] [address]    Culture   Final    NO GROWTH 5 DAYS Performed at [facility], [address], [place],  [place] [address]    Report Status 08/06/2022 FINAL  Final  Aerobic Culture w Gram Stain (superficial specimen)     Status: None   Collection Time: 08/02/22 12:43 AM   Specimen: Abscess  Result Value Ref Range Status   Specimen Description   Final    ABSCESS RT HAND Performed at [facility], [address] [place], [place] [address]    Special Requests   Final    NONE Performed at [facility], [address], [place], [place] [address]    Gram Stain NO WBC SEEN NO ORGANISMS SEEN   Final   Culture   Final    NO GROWTH 2 DAYS Performed at [facility], [address] [place], [place] [address]    Report Status 08/05/2022 FINAL  Final  Gastrointestinal Panel by PCR , Stool     Status: None   Collection Time: 08/05/22  9:27 AM   Specimen: Stool  Result Value Ref Range Status   Campylobacter species NOT DETECTED NOT DETECTED Final   Plesimonas shigelloides NOT DETECTED NOT DETECTED Final   Salmonella species NOT DETECTED NOT DETECTED Final   Yersinia enterocolitica NOT DETECTED NOT DETECTED Final   Vibrio species NOT DETECTED NOT DETECTED Final   Vibrio cholerae NOT DETECTED NOT DETECTED Final   Enteroaggregative E coli (EAEC) NOT DETECTED NOT DETECTED Final   Enteropathogenic E coli (EPEC) NOT DETECTED NOT DETECTED Final   Enterotoxigenic E coli (ETEC) NOT DETECTED NOT DETECTED Final   Shiga like toxin producing E coli (STEC) NOT DETECTED NOT DETECTED Final    Shigella/Enteroinvasive E coli (EIEC) NOT DETECTED NOT DETECTED Final   Cryptosporidium NOT DETECTED NOT DETECTED Final   Cyclospora cayetanensis NOT DETECTED NOT DETECTED Final   Entamoeba histolytica NOT DETECTED NOT DETECTED Final   Giardia lamblia NOT DETECTED NOT DETECTED Final   Adenovirus F40/41 NOT DETECTED NOT DETECTED Final   Astrovirus NOT DETECTED NOT DETECTED Final   Norovirus GI/GII NOT DETECTED NOT DETECTED Final   Rotavirus A NOT DETECTED NOT DETECTED Final   Sapovirus (I, II, IV, and V) NOT DETECTED NOT DETECTED Final    Comment: Performed at [facility], [address] [place], [place] [address]  Aerobic/Anaerobic Culture w Gram Stain (surgical/deep wound)     Status: None (Preliminary result)   Collection Time: 08/05/22  6:58 PM   Specimen: Wound  Result Value Ref Range Status   Specimen Description   Final    WOUND RIGHT RING FINGER PIP JOINT CULTURE Performed at [facility], [address] [place], [place] [address]    Special Requests   Final    NONE Performed at [facility], [address], [place], [place] [address]    Gram Stain   Final    RARE WBC PRESENT,BOTH PMN AND MONONUCLEAR NO ORGANISMS SEEN    Culture   Final    NO GROWTH 1 DAY Performed at [facility], [address], [place], [place] [address]    Report Status PENDING  Incomplete  Aerobic/Anaerobic Culture w Gram Stain (surgical/deep wound)     Status: None (Preliminary result)   Collection Time: 08/05/22  7:15 PM   Specimen: Wound  Result Value Ref Range Status   Specimen Description   Final    WOUND RIGHT RING FINGER PIP JOINT Performed at [facility], [address] [place], [place] [address]    Special Requests   Final  NONE Performed at [facility], [address] [place], [place] [address]    Gram Stain NO WBC SEEN NO ORGANISMS SEEN   Final   Culture   Final    NO GROWTH 1 DAY Performed  at [facility], [address] [place], [place] [address]    Report Status PENDING  Incomplete     Medications:    enoxaparin (LOVENOX) injection  40 mg Subcutaneous Q24H   escitalopram  10 mg Oral Daily   Continuous Infusions:  cefTRIAXone (ROCEPHIN)  IV 2 g (08/07/22 0840)   metronidazole 500 mg (08/07/22 0922)      LOS: 6 days   Marinda Elk  [facility]  08/07/2022, 11:28 AM

## 2022-08-07 NOTE — Anesthesia Postprocedure Evaluation (Signed)
Anesthesia Post Note  Patient: Yesenia Long  Procedure(s) Performed: IRRIGATION AND DEBRIDEMENT WOUND (Right)     Patient location during evaluation: PACU Anesthesia Type: General Level of consciousness: awake and alert Pain management: pain level controlled Vital Signs Assessment: post-procedure vital signs reviewed and stable Respiratory status: spontaneous breathing, nonlabored ventilation, respiratory function stable and patient connected to nasal cannula oxygen Cardiovascular status: blood pressure returned to baseline and stable Postop Assessment: no apparent nausea or vomiting Anesthetic complications: no   No notable events documented.  Last Vitals:  Vitals:   08/07/22 0626 08/07/22 1259  BP: 97/67 111/68  Pulse: 62 63  Resp: 14 18  Temp: 36.5 C 36.7 C  SpO2: 98% 100%    Last Pain:  Vitals:   08/07/22 1620  TempSrc:   PainSc: 7                  Kennieth Rad

## 2022-08-08 DIAGNOSIS — L0390 Cellulitis, unspecified: Secondary | ICD-10-CM | POA: Diagnosis not present

## 2022-08-08 DIAGNOSIS — S61259A Open bite of unspecified finger without damage to nail, initial encounter: Secondary | ICD-10-CM | POA: Diagnosis not present

## 2022-08-08 DIAGNOSIS — W5501XA Bitten by cat, initial encounter: Secondary | ICD-10-CM | POA: Diagnosis not present

## 2022-08-08 DIAGNOSIS — L089 Local infection of the skin and subcutaneous tissue, unspecified: Secondary | ICD-10-CM | POA: Diagnosis not present

## 2022-08-08 DIAGNOSIS — S61452A Open bite of left hand, initial encounter: Secondary | ICD-10-CM | POA: Diagnosis not present

## 2022-08-08 LAB — AEROBIC/ANAEROBIC CULTURE W GRAM STAIN (SURGICAL/DEEP WOUND)

## 2022-08-08 LAB — C-REACTIVE PROTEIN: CRP: 0.8 mg/dL (ref <1.0)

## 2022-08-08 LAB — FUNGUS CULTURE WITH STAIN

## 2022-08-08 MED ORDER — RABIES VACCINE, PCEC IM SUSR
1.0000 mL | Freq: Once | INTRAMUSCULAR | Status: AC
  Administered 2022-08-08: 1 mL via INTRAMUSCULAR
  Filled 2022-08-08: qty 1

## 2022-08-08 MED ORDER — RABIES IMMUNE GLOBULIN 150 UNIT/ML IM INJ: 20.0000 [IU]/kg | INJECTION | Freq: Once | INTRAMUSCULAR | Status: DC

## 2022-08-08 MED ORDER — RABIES IMMUNE GLOBULIN 150 UNIT/ML IM INJ
150.0000 [IU] | INJECTION | Freq: Once | INTRAMUSCULAR | Status: DC
  Filled 2022-08-08: qty 2

## 2022-08-08 MED ORDER — SODIUM CHLORIDE 0.9 % IV SOLN
3.0000 g | Freq: Four times a day (QID) | INTRAVENOUS | Status: DC
  Administered 2022-08-08 – 2022-08-13 (×19): 3 g via INTRAVENOUS
  Filled 2022-08-08 (×22): qty 8

## 2022-08-08 MED ORDER — RABIES VACCINE, PCEC IM SUSR: 1.0000 mL | Freq: Once | INTRAMUSCULAR | Status: DC

## 2022-08-08 MED ORDER — RABIES IMMUNE GLOBULIN 150 UNIT/ML IM INJ
1500.0000 [IU] | INJECTION | Freq: Once | INTRAMUSCULAR | Status: DC
  Filled 2022-08-08: qty 10

## 2022-08-08 NOTE — Consult Note (Signed)
[facility]    Date of Admission:  08/01/2022     Reason for Consult: cat bite infection    Referring Provider: David Stall     Lines:  peripheral  Abx: 4/25-c metronidazole 4/25-c ceftriaxone  Outpatient amox-clav  Assessment: 37 yo female with cat bite bilateral fingers and associated soft tissue infection, s/p I&D of right 3rd and 4th finger  Initial cat bite 4/24. Given amox-clav (child hood pcn allergy but tolerated amox-clav) in urgent care same day prophylaxis. Cellulitis bilateral hand developed despite 2 days amox-clav  Admitted 4/25 for progression of LUE cellulitis and right 3rd/4th finger pain/swelling/redness  4/25 admission bcx negative S/p bedside I&D 4/26 right hand -- cx negative S/p I&D 4/29 right 3rd and 4th finger with pip joint -- cx ngtd. Operative note reviewed. No purulent material encountered.   Patient with stable swelling/redness of the 4th finger still on right side as of today POD #3   4/25 admission mri bilateral hands soft tissue swelling but no OM finding. However, this is obtained rather early in the course  I do not appreciate any worsening at this time and it might take several days for improvement      Plan: Would monitor until Monday next week and possible repeat mri of the right hand to see if bone involvement present. If mri repeat normal would treat with 3 weeks amox-clav Continue iv abx for now but would change to amp-sulbactam as that would potentially less diarrhea inducing than ceftriaxone/flagyl She has gotten passive rabies vaccination; her cat observed >10 days is fine Discussed with primary team   I spent more than 80 minute reviewing data/chart, and coordinating care, providing direct face to face time providing counseling/discussing diagnostics/treatment plan with patient and treatment team       ------------------------------------------------ Principal Problem:   Cat  bite of multiple sites of left hand and fingers with infection Active Problems:   Cat bite of right hand including fingers with infection    HPI: Yesenia Long is a 37 y.o. female with cat bite bilateral fingers and associated soft tissue infection, s/p I&D of right 3rd and 4th finger    Patient found her lost cat who was scared, and suffered several bites to bilateral hands/shoulders on 4/24. Given amox-clav (child hood pcn allergy but tolerated amox-clav) in urgent care same day prophylaxis. Cellulitis bilateral hand developed despite 2 days amox-clav  Admitted 4/25 for progression of LUE cellulitis and right 3rd/4th finger pain/swelling/redness  4/25 admission bcx negative S/p bedside I&D 4/26 right hand -- cx negative S/p I&D 4/29 right 3rd and 4th finger with pip joint -- cx ngtd. Operative note reviewed. No purulent material encountered.   Patient with stable swelling/redness of the 4th finger still on right side as of today POD #3  At initial presentation afebrile; hds, no leukocytosis  Mri obtained but no om or septic arthritis finding    History reviewed. No pertinent family history.  Social History   Tobacco Use   Smoking status: Unknown  Substance Use Topics   Alcohol use: Never   Drug use: Never    Allergies  Allergen Reactions   Penicillins Rash    Patient states "all cillins"    Review of Systems: ROS All Other ROS was negative, except mentioned above   Past Medical History:  Diagnosis Date   Anxiety    Fever blister        Scheduled Meds:  enoxaparin (LOVENOX) injection  40 mg Subcutaneous Q24H   escitalopram  10 mg Oral Daily   Continuous Infusions:  cefTRIAXone (ROCEPHIN)  IV 2 g (08/08/22 0805)   metronidazole 500 mg (08/08/22 0944)   PRN Meds:.acetaminophen **OR** acetaminophen, ondansetron **OR** ondansetron (ZOFRAN) IV, oxyCODONE, senna-docusate   OBJECTIVE: Blood pressure 118/73, pulse 64, temperature 97.9 F (36.6 C),  temperature source Oral, resp. rate 18, height 5\' 7"  (1.702 m), weight 82.6 kg, SpO2 100 %.  Physical Exam  General/constitutional: no distress, pleasant HEENT: Normocephalic, PER, Conj Clear, EOMI, Oropharynx clear Neck supple CV: rrr no mrg Lungs: clear to auscultation, normal respiratory effort Abd: Soft, Nontender Ext: no edema Skin: No Rash Neuro: nonfocal MSK: right hand 4th finger pip joint still limited rom and tender/swollen.   5/02      4/25      Lab Results Lab Results  Component Value Date   WBC 7.0 08/07/2022   HGB 12.4 08/07/2022   HCT 38.3 08/07/2022   MCV 95.8 08/07/2022   PLT 213 08/07/2022    Lab Results  Component Value Date   CREATININE 0.68 08/07/2022   BUN 15 08/07/2022   NA 137 08/07/2022   K 4.0 08/07/2022   CL 106 08/07/2022   CO2 25 08/07/2022    Lab Results  Component Value Date   ALT 17 08/01/2022   AST 27 08/01/2022   ALKPHOS 45 08/01/2022   BILITOT 0.5 08/01/2022      Microbiology: Recent Results (from the past 240 hour(s))  Blood culture (routine x 2)     Status: None   Collection Time: 08/01/22  2:26 PM   Specimen: BLOOD LEFT FOREARM  Result Value Ref Range Status   Specimen Description   Final    BLOOD LEFT FOREARM Performed at [facility], [address], [place], [place] [address]    Special Requests   Final    BOTTLES DRAWN AEROBIC AND ANAEROBIC Blood Culture adequate volume Performed at [facility], [address] [place], [place] [address]    Culture   Final    NO GROWTH 5 DAYS Performed at [facility], [address] [place], [place] [address]    Report Status 08/06/2022 FINAL  Final  Blood culture (routine x 2)     Status: None   Collection Time: 08/01/22  2:26 PM   Specimen: BLOOD  Result Value Ref Range Status   Specimen Description   Final    BLOOD RIGHT ANTECUBITAL Performed at [facility], [address] [place], [place] [address]     Special Requests   Final    BOTTLES DRAWN AEROBIC AND ANAEROBIC Blood Culture results may not be optimal due to an excessive volume of blood received in culture bottles Performed at [facility], [address] [place], [place] [address]    Culture   Final    NO GROWTH 5 DAYS Performed at [facility], [address], [place], [place] [address]    Report Status 08/06/2022 FINAL  Final  Fungus Culture With Stain     Status: None (Preliminary result)   Collection Time: 08/02/22 12:43 AM   Specimen: Hand, Right  Result Value Ref Range Status   Fungus Stain Final report  Final    Comment: (NOTE) Performed At: [facility] [address] [place], [place] [id number] Jolene Schimke MD BM:[id number]    Fungus (Mycology) Culture PENDING  Incomplete   Fungal Source ABCESS, RIGHT HAND  Final    Comment: Performed at [facility], [address] [place], [place] [address]  Aerobic Culture w Gram Stain (superficial specimen)     Status: None   Collection Time: 08/02/22 12:43 AM   Specimen: Abscess  Result Value Ref Range Status   Specimen Description   Final    ABSCESS RT HAND Performed at [facility], [address], [place], [place] [address]    Special Requests   Final    NONE Performed at [facility], [address], [place], [place] [address]    Gram Stain NO WBC SEEN NO ORGANISMS SEEN   Final   Culture   Final    NO GROWTH 2 DAYS Performed at [facility], [address] [place], [place] [address]    Report Status 08/05/2022 FINAL  Final  Fungus Culture Result     Status: None   Collection Time: 08/02/22 12:43 AM  Result Value Ref Range Status   Result 1 Comment  Final    Comment: (NOTE) KOH/Calcofluor preparation:  no fungus observed. Performed At: [facility] [address] [place], [place] [id number] Jolene Schimke MD MV:[id number]   Gastrointestinal Panel by  PCR , Stool     Status: None   Collection Time: 08/05/22  9:27 AM   Specimen: Stool  Result Value Ref Range Status   Campylobacter species NOT DETECTED NOT DETECTED Final   Plesimonas shigelloides NOT DETECTED NOT DETECTED Final   Salmonella species NOT DETECTED NOT DETECTED Final   Yersinia enterocolitica NOT DETECTED NOT DETECTED Final   Vibrio species NOT DETECTED NOT DETECTED Final   Vibrio cholerae NOT DETECTED NOT DETECTED Final   Enteroaggregative E coli (EAEC) NOT DETECTED NOT DETECTED Final   Enteropathogenic E coli (EPEC) NOT DETECTED NOT DETECTED Final   Enterotoxigenic E coli (ETEC) NOT DETECTED NOT DETECTED Final   Shiga like toxin producing E coli (STEC) NOT DETECTED NOT DETECTED Final   Shigella/Enteroinvasive E coli (EIEC) NOT DETECTED NOT DETECTED Final   Cryptosporidium NOT DETECTED NOT DETECTED Final   Cyclospora cayetanensis NOT DETECTED NOT DETECTED Final   Entamoeba histolytica NOT DETECTED NOT DETECTED Final   Giardia lamblia NOT DETECTED NOT DETECTED Final   Adenovirus F40/41 NOT DETECTED NOT DETECTED Final   Astrovirus NOT DETECTED NOT DETECTED Final   Norovirus GI/GII NOT DETECTED NOT DETECTED Final   Rotavirus A NOT DETECTED NOT DETECTED Final   Sapovirus (I, II, IV, and V) NOT DETECTED NOT DETECTED Final    Comment: Performed at [facility], [address] [place], [place] [address]  Aerobic/Anaerobic Culture w Gram Stain (surgical/deep wound)     Status: None (Preliminary result)   Collection Time: 08/05/22  6:58 PM   Specimen: Wound  Result Value Ref Range Status   Specimen Description   Final    WOUND RIGHT RING FINGER PIP JOINT CULTURE Performed at [facility], [address] [place], [place] [address]    Special Requests   Final    NONE Performed at [facility], [address] [place], [place] [address]    Gram Stain   Final    RARE WBC PRESENT,BOTH PMN AND MONONUCLEAR NO ORGANISMS SEEN     Culture   Final    NO GROWTH 2 DAYS NO ANAEROBES ISOLATED; CULTURE IN PROGRESS FOR 5 DAYS Performed at [facility], [address], [place], [place] [address]    Report Status  PENDING  Incomplete  Acid Fast Smear (AFB)     Status: None   Collection Time: 08/05/22  6:58 PM   Specimen: Wound  Result Value Ref Range Status   AFB Specimen Processing Concentration  Final   Acid Fast Smear Negative  Final    Comment: (NOTE) Performed At: [facility] [address] [place], [place] [id number] Jolene Schimke MD NW:[id number]    Source (AFB) WOUND  Final    Comment: RIGHT RING FINGER PIP JOINT  Performed at [facility], [address] [place], [place] [address]   Aerobic/Anaerobic Culture w Gram Stain (surgical/deep wound)     Status: None (Preliminary result)   Collection Time: 08/05/22  7:15 PM   Specimen: Wound  Result Value Ref Range Status   Specimen Description   Final    WOUND RIGHT RING FINGER PIP JOINT Performed at [facility], [address] [place], [place] [address]    Special Requests   Final    NONE Performed at [facility], [address], [place], [place] [address]    Gram Stain NO WBC SEEN NO ORGANISMS SEEN   Final   Culture   Final    NO GROWTH 2 DAYS NO ANAEROBES ISOLATED; CULTURE IN PROGRESS FOR 5 DAYS Performed at [facility], [address], [place], [place] [address]    Report Status PENDING  Incomplete  Acid Fast Smear (AFB)     Status: None   Collection Time: 08/05/22  7:15 PM   Specimen: Wound  Result Value Ref Range Status   AFB Specimen Processing Concentration  Final   Acid Fast Smear Negative  Final    Comment: (NOTE) Performed At: [facility] [address] [place], [place] [id number] Jolene Schimke MD QI:[id number]    Source (AFB) WOUND  Final    Comment: RIGHT RING FINGER PIP JOINT  Performed at [facility], [address] [place],  [place] [address]      Serology:    Imaging: If present, new imagings (plain films, ct scans, and mri) have been personally visualized and interpreted; radiology reports have been reviewed. Decision making incorporated into the Impression / Recommendations.  4/25 mri left/right hand 1. There is moderate subcutaneous fat edema and swelling dorsal to the second through fourth metacarpals and extending into the third finger. There is mildly confluence fluid around the border of this region, greatest at the level of the mid metacarpal shafts, however this does not appear to enhance, and no well-defined wall of an abscess is seen at this time. This presumably reflects cellulitis and inflammatory phlegmon. 2. There is only trace fluid within the third and fourth extensor tendon sheaths at the level of the proximal and mid metacarpals. No tendon sheath enhancement in this region. MRI cannot exclude the presence of infection within this fluid. 3. There is a focal focus of extensor tendon sheath enhancement within the third finger at the level of the third metacarpal neck and again focally within the extensor tendon sheath at the level of the PIP joint. 4. There is a tiny index finger PIP joint effusion. No associated synovial enhancement. 5. No MRI findings of acute osteomyelitis.  Raymondo Band, MD [facility] [phone] pager    08/08/2022, 5:28 PM

## 2022-08-08 NOTE — Progress Notes (Signed)
Pharmacy Antibiotic Note  Yesenia Long is a 37 y.o. female admitted on 08/01/2022 with cat bite bilateral fingers and associated soft tissue infection, s/p I&D of right 3rd and 4th finger.  Pharmacy has been consulted for Unasyn dosing by ID who also entered the order for Unasyn 3 g IV every 6 hours.  Plan: Continue Unasyn 3 g IV every 6 hours Do not anticipate dose adjustment and will sign off consult yet will continue to monitor clinical progress and renal function   Height: 5\' 7"  (170.2 cm) Weight: 82.6 kg (182 lb 1.6 oz) IBW/kg (Calculated) : 61.6  Temp (24hrs), Avg:98 F (36.7 C), Min:97.8 F (36.6 C), Max:98.2 F (36.8 C)  Recent Labs  Lab 08/02/22 0037 08/03/22 0652 08/05/22 0556 08/06/22 0555 08/07/22 0537  WBC 7.7 4.1 5.2 8.1 7.0  CREATININE 0.66 0.55  --  0.81 0.68    Estimated Creatinine Clearance: 104.3 mL/min (by C-G formula based on SCr of 0.68 mg/dL).    Allergies  Allergen Reactions   Penicillins Rash    Patient states "all cillins"    Antimicrobials this admission: 4/25 clindamycin x 1 4/26 azithromycin >>4/30 4/25 Flagyl, Rocephin >> 5/2 5/2 Unasyn >>    Microbiology results: 4/25 BCx: NGTD 4/26 abscess rt hand: NGTD 4/26 Fungus right hand: pending 4/26 AFB rt hand: sent 4/29 wound rt finger joint x 2:pending 4/29 fungus culture with stain of wound x 2: sent 4/29 acid fast culture wound x 2: sent 4/29 AFB rt finger joint x 2: neg 4/29 gi panel: neg  Thank you for allowing pharmacy to be a part of this patient's care.  Selinda Eon, PharmD, BCPS Clinical Pharmacist [place] Please utilize Amion for appropriate phone number to reach the unit pharmacist [facility]) 08/08/2022 6:13 PM

## 2022-08-08 NOTE — Progress Notes (Signed)
[facility] PROGRESS NOTE    Progress Note  Yesenia Long  WNU:[id number] DOB: 08-05-1985 DOA: 08/01/2022 PCP: Collene Mares, PA     Brief Narrative:   Yesenia Long is an 37 y.o. female past medical history significant for anxiety presents to the ED for evaluation of a cat bite   Assessment/Plan:   Cellulitis of multiple sites both hands left shoulder secondary to  Cat bite of multiple sites of left hand and fingers with infection status post I&D by hand surgeon: Satus post I&D in the OR on 08/05/2022 Started initially on Rocephin and Flagyl and oral azithromycin on admission to cover for scratch disease. MRI of both hands was noted for cellulitis suspicious for right ring finger PIP effusion. Blood cultures have been negative to 2. 4 doses of the rabies vaccine over 14 days (day 0, 3, 7 and 14) received immunoglobulin and 1st rabies vaccine on 4/25, 2nd dose on 4/28, next doses will be on 5/2 and 5/9 to complete the series  Awaiting cultures to tailor antibiotics. Awaiting hand surgery further recommendations.  History of anxiety: Continue Lexapro.   DVT prophylaxis: lovenox Family Communication:none Status is: Inpatient Remains inpatient appropriate because: Cellulitis of multiple sites    Code Status:     Code Status Orders  (From admission, onward)           Start     Ordered   08/01/22 1908  Full code  Continuous       Question:  By:  Answer:  Consent: discussion documented in EHR   08/01/22 1908           Code Status History     This patient has a current code status but no historical code status.         IV Access:   Peripheral IV   Procedures and diagnostic studies:   No results found.   Medical Consultants:   None.   Subjective:    Yesenia Long relates her pain is and movement of her hand is much better  Objective:    Vitals:   08/07/22 0626 08/07/22 1259 08/07/22 2041 08/08/22 0554  BP: 97/67 111/68  112/74 105/60  Pulse: 62 63 (!) 58 (!) 51  Resp: 14 18 17 17   Temp: 97.7 F (36.5 C) 98.1 F (36.7 C) 98.2 F (36.8 C) 97.8 F (36.6 C)  TempSrc: Oral Oral Oral Oral  SpO2: 98% 100% 100% 99%  Weight:      Height:       SpO2: 99 %   Intake/Output Summary (Last 24 hours) at 08/08/2022 0917 Last data filed at 08/07/2022 1856 Gross per 24 hour  Intake 597 ml  Output --  Net 597 ml    Filed Weights   08/01/22 1134 08/01/22 1323 08/05/22 1823  Weight: 82.6 kg 82.6 kg 82.6 kg    Exam: General exam: In no acute distress. Respiratory system: Good air movement and clear to auscultation. Cardiovascular system: S1 & S2 heard, RRR. No JVD. Gastrointestinal system: Abdomen is nondistended, soft and nontender.  Extremities: No pedal edema. Skin: No rashes, lesions or ulcers Psychiatry: Judgement and insight appear normal. Mood & affect appropriate. Data Reviewed:    Labs: Basic Metabolic Panel: Recent Labs  Lab 08/01/22 1426 08/02/22 0037 08/03/22 0652 08/06/22 0555 08/07/22 0537  NA 136 136 136 134* 137  K 4.9 3.6 4.2 4.3 4.0  CL 104 103 107 102 106  CO2 25 24 21* 23 25  GLUCOSE 82 92 81  151* 83  BUN 12 14 12 15 15   CREATININE 0.63 0.66 0.55 0.81 0.68  CALCIUM 9.3 8.4* 8.1* 8.4* 8.3*    GFR Estimated Creatinine Clearance: 104.3 mL/min (by C-G formula based on SCr of 0.68 mg/dL). Liver Function Tests: Recent Labs  Lab 08/01/22 1426  AST 27  ALT 17  ALKPHOS 45  BILITOT 0.5  PROT 7.2  ALBUMIN 4.7    No results for input(s): "LIPASE", "AMYLASE" in the last 168 hours. No results for input(s): "AMMONIA" in the last 168 hours. Coagulation profile No results for input(s): "INR", "PROTIME" in the last 168 hours. COVID-19 Labs  No results for input(s): "DDIMER", "FERRITIN", "LDH", "CRP" in the last 72 hours.  No results found for: "SARSCOV2NAA"  CBC: Recent Labs  Lab 08/01/22 1426 08/02/22 0037 08/03/22 0652 08/05/22 0556 08/06/22 0555 08/07/22 0537   WBC 9.6 7.7 4.1 5.2 8.1 7.0  NEUTROABS 7.4  --  2.0  --   --   --   HGB 14.7 13.0 12.3 13.0 14.0 12.4  HCT 42.7 38.3 38.0 40.4 42.7 38.3  MCV 91.8 94.8 98.7 98.1 93.6 95.8  PLT 249 216 151 188 264 213    Cardiac Enzymes: No results for input(s): "CKTOTAL", "CKMB", "CKMBINDEX", "TROPONINI" in the last 168 hours. BNP (last 3 results) No results for input(s): "PROBNP" in the last 8760 hours. CBG: No results for input(s): "GLUCAP" in the last 168 hours. D-Dimer: No results for input(s): "DDIMER" in the last 72 hours. Hgb A1c: No results for input(s): "HGBA1C" in the last 72 hours. Lipid Profile: No results for input(s): "CHOL", "HDL", "LDLCALC", "TRIG", "CHOLHDL", "LDLDIRECT" in the last 72 hours. Thyroid function studies: No results for input(s): "TSH", "T4TOTAL", "T3FREE", "THYROIDAB" in the last 72 hours.  Invalid input(s): "FREET3" Anemia work up: No results for input(s): "VITAMINB12", "FOLATE", "FERRITIN", "TIBC", "IRON", "RETICCTPCT" in the last 72 hours. Sepsis Labs: Recent Labs  Lab 08/03/22 0652 08/05/22 0556 08/06/22 0555 08/07/22 0537  WBC 4.1 5.2 8.1 7.0    Microbiology Recent Results (from the past 240 hour(s))  Blood culture (routine x 2)     Status: None   Collection Time: 08/01/22  2:26 PM   Specimen: BLOOD LEFT FOREARM  Result Value Ref Range Status   Specimen Description   Final    BLOOD LEFT FOREARM Performed at [facility], [address], [place], [place] [address]    Special Requests   Final    BOTTLES DRAWN AEROBIC AND ANAEROBIC Blood Culture adequate volume Performed at [facility], [address] [place], [place] [address]    Culture   Final    NO GROWTH 5 DAYS Performed at [facility], [address] [place], [place] [address]    Report Status 08/06/2022 FINAL  Final  Blood culture (routine x 2)     Status: None   Collection Time: 08/01/22  2:26 PM   Specimen: BLOOD  Result Value Ref Range Status    Specimen Description   Final    BLOOD RIGHT ANTECUBITAL Performed at [facility], [address] [place], [place] [address]    Special Requests   Final    BOTTLES DRAWN AEROBIC AND ANAEROBIC Blood Culture results may not be optimal due to an excessive volume of blood received in culture bottles Performed at [facility], [address], [place], [place] [address]    Culture   Final    NO GROWTH 5 DAYS Performed at [facility], [address], [place], [place] [address]    Report Status 08/06/2022 FINAL  Final  Aerobic Culture w Gram Stain (superficial specimen)     Status: None   Collection Time: 08/02/22 12:43 AM   Specimen: Abscess  Result Value Ref Range Status   Specimen Description   Final    ABSCESS RT HAND Performed at [facility], [address], [place], [place] [address]    Special Requests   Final    NONE Performed at [facility], [address] [place], [place] [address]    Gram Stain NO WBC SEEN NO ORGANISMS SEEN   Final   Culture   Final    NO GROWTH 2 DAYS Performed at [facility], [address], [place], [place] [address]    Report Status 08/05/2022 FINAL  Final  Gastrointestinal Panel by PCR , Stool     Status: None   Collection Time: 08/05/22  9:27 AM   Specimen: Stool  Result Value Ref Range Status   Campylobacter species NOT DETECTED NOT DETECTED Final   Plesimonas shigelloides NOT DETECTED NOT DETECTED Final   Salmonella species NOT DETECTED NOT DETECTED Final   Yersinia enterocolitica NOT DETECTED NOT DETECTED Final   Vibrio species NOT DETECTED NOT DETECTED Final   Vibrio cholerae NOT DETECTED NOT DETECTED Final   Enteroaggregative E coli (EAEC) NOT DETECTED NOT DETECTED Final   Enteropathogenic E coli (EPEC) NOT DETECTED NOT DETECTED Final   Enterotoxigenic E coli (ETEC) NOT DETECTED NOT DETECTED Final   Shiga like toxin producing E coli (STEC) NOT  DETECTED NOT DETECTED Final   Shigella/Enteroinvasive E coli (EIEC) NOT DETECTED NOT DETECTED Final   Cryptosporidium NOT DETECTED NOT DETECTED Final   Cyclospora cayetanensis NOT DETECTED NOT DETECTED Final   Entamoeba histolytica NOT DETECTED NOT DETECTED Final   Giardia lamblia NOT DETECTED NOT DETECTED Final   Adenovirus F40/41 NOT DETECTED NOT DETECTED Final   Astrovirus NOT DETECTED NOT DETECTED Final   Norovirus GI/GII NOT DETECTED NOT DETECTED Final   Rotavirus A NOT DETECTED NOT DETECTED Final   Sapovirus (I, II, IV, and V) NOT DETECTED NOT DETECTED Final    Comment: Performed at [facility], [address] [place], [place] [address]  Aerobic/Anaerobic Culture w Gram Stain (surgical/deep wound)     Status: None (Preliminary result)   Collection Time: 08/05/22  6:58 PM   Specimen: Wound  Result Value Ref Range Status   Specimen Description   Final    WOUND RIGHT RING FINGER PIP JOINT CULTURE Performed at [facility], [address], [place], [place] [address]    Special Requests   Final    NONE Performed at [facility], [address] [place], [place] [address]    Gram Stain   Final    RARE WBC PRESENT,BOTH PMN AND MONONUCLEAR NO ORGANISMS SEEN    Culture   Final    NO GROWTH 2 DAYS Performed at [facility], [address], [place], [place] [address]    Report Status PENDING  Incomplete  Acid Fast Smear (AFB)     Status: None   Collection Time: 08/05/22  6:58 PM   Specimen: Wound  Result Value Ref Range Status   AFB Specimen Processing Concentration  Final   Acid Fast Smear Negative  Final    Comment: (NOTE) Performed At: [facility] [address] [place], [place] [id number] Jolene Schimke MD  ZO:[id number]    Source (AFB) WOUND  Final    Comment: RIGHT RING FINGER PIP JOINT  Performed at [facility], [address], [place], [place] [address]   Aerobic/Anaerobic Culture w  Gram Stain (surgical/deep wound)     Status: None (Preliminary result)   Collection Time: 08/05/22  7:15 PM   Specimen: Wound  Result Value Ref Range Status   Specimen Description   Final    WOUND RIGHT RING FINGER PIP JOINT Performed at [facility], [address], [place], [place] [address]    Special Requests   Final    NONE Performed at [facility], [address], [place], [place] [address]    Gram Stain NO WBC SEEN NO ORGANISMS SEEN   Final   Culture   Final    NO GROWTH 2 DAYS Performed at [facility], [address] [place], [place] [address]    Report Status PENDING  Incomplete  Acid Fast Smear (AFB)     Status: None   Collection Time: 08/05/22  7:15 PM   Specimen: Wound  Result Value Ref Range Status   AFB Specimen Processing Concentration  Final   Acid Fast Smear Negative  Final    Comment: (NOTE) Performed At: [facility] [address] [place], [place] [id number] Jolene Schimke MD WU:[id number]    Source (AFB) WOUND  Final    Comment: RIGHT RING FINGER PIP JOINT  Performed at [facility], [address], [place], [place] [address]      Medications:    enoxaparin (LOVENOX) injection  40 mg Subcutaneous Q24H   escitalopram  10 mg Oral Daily   Continuous Infusions:  cefTRIAXone (ROCEPHIN)  IV 2 g (08/08/22 0805)   metronidazole 500 mg (08/07/22 2009)      LOS: 7 days   Marinda Elk  [facility]  08/08/2022, 9:17 AM

## 2022-08-08 NOTE — Progress Notes (Addendum)
Ortho Progress Note  S: Patient doing fairly well, still having pain with right ring finger PIP movement or especially if bumped.  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  WBC normal.  No growth on cultures to date.  Rare WBC present on gram stain.   O: Incisions over dorsoradial aspect of right ring and long fingers without drainage.  Sutures intact.  Right ring finger dorsal aspect over PIP joint erythematous and swollen.  Range of motion of PIP joint is 20 deg of flexion to 60 deg of flexion, limited by pain.  Nontender along volar aspect of digit although does endorse some pain over dorsal with palpation .   No fusiform swelling of digit, only localized to proximal phalanx and PIP joint. Specifically, erythema, swelling and pain isolated to dorsal aspect of digit around the prox phalanx and PIP joint.  Improved flexion of MCP joints ring: 10 hyperextension to 80 flexion,  long 10 hyperextension to 90 deg flexion.  Minimal erythema along long finger.  Range of motion of long finger also limited but improved compared to ring (10-70 deg).  Sensation and motor intact. Brisk cap refill.    A/P: 13F with cat bites s/p bedside I&D of right ring PIP joint on 4/26 then OR I&D of right ring and right long finger PIP joints on 4/29.  Patient is improving but fairly slowly.  Would like to obtain CRP q24hrs to monitor for improvement in symptoms.  Recommend consideration of change of antibiotics given slow recovery and/or ID consult.  I think likelihood of additional surgery being helpful is low at this time.  Additionally, I think MRI would not be useful as it would be confounded by normal post op changes.  No concern for flexor tenosynovitis.  Likelihood of persistent septic arthritis is low given intra-op findings of no purulence and no cells or growth on cultures so far.   Shaune Pollack, MD Hand & Upper  Extremity Surgery [facility] [place] [phone]

## 2022-08-09 DIAGNOSIS — L089 Local infection of the skin and subcutaneous tissue, unspecified: Secondary | ICD-10-CM | POA: Diagnosis not present

## 2022-08-09 DIAGNOSIS — S61452D Open bite of left hand, subsequent encounter: Secondary | ICD-10-CM | POA: Diagnosis not present

## 2022-08-09 DIAGNOSIS — S61452A Open bite of left hand, initial encounter: Secondary | ICD-10-CM | POA: Diagnosis not present

## 2022-08-09 DIAGNOSIS — S61259A Open bite of unspecified finger without damage to nail, initial encounter: Secondary | ICD-10-CM | POA: Diagnosis not present

## 2022-08-09 DIAGNOSIS — W5501XA Bitten by cat, initial encounter: Secondary | ICD-10-CM | POA: Diagnosis not present

## 2022-08-09 DIAGNOSIS — W5501XD Bitten by cat, subsequent encounter: Secondary | ICD-10-CM | POA: Diagnosis not present

## 2022-08-09 LAB — FUNGUS CULTURE WITH STAIN

## 2022-08-09 LAB — AEROBIC/ANAEROBIC CULTURE W GRAM STAIN (SURGICAL/DEEP WOUND): Gram Stain: NONE SEEN

## 2022-08-09 LAB — FUNGUS CULTURE RESULT

## 2022-08-09 LAB — C-REACTIVE PROTEIN: CRP: <0.5 mg/dL (ref <1.0)

## 2022-08-09 MED ORDER — RABIES VACCINE, PCEC IM SUSR: 1.0000 mL | Freq: Once | INTRAMUSCULAR | Status: DC

## 2022-08-09 NOTE — Progress Notes (Signed)
[facility]  Date of Admission:  08/01/2022     Lines:  peripheral   Abx: 5/2-c amp-sulb  4/25-5/2 metronidazole 4/25-5/2 ceftriaxone   Outpatient amox-clav   Assessment: 37 yo female with cat bite bilateral fingers and associated soft tissue infection, s/p I&D of right 3rd and 4th finger   Initial cat bite 4/24. Given amox-clav (child hood pcn allergy but tolerated amox-clav) in urgent care same day prophylaxis. Cellulitis bilateral hand developed despite 2 days amox-clav   Admitted 4/25 for progression of LUE cellulitis and right 3rd/4th finger pain/swelling/redness   4/25 admission bcx negative S/p bedside I&D 4/26 right hand -- cx negative S/p I&D 4/29 right 3rd and 4th finger with pip joint -- cx ngtd. Operative note reviewed. No purulent material encountered.    Patient with stable swelling/redness of the 4th finger still on right side as of today POD #3     4/25 admission mri bilateral hands soft tissue swelling but no OM finding. However, this is obtained rather early in the course   I do not appreciate any worsening at this time and it might take several days for improvement      --------------- 08/09/22 assessment Stable finding right 4th pip swelling and mild-moderate tenderness -- no dehiscence/purulence Afebrile HS-Crp normal   Tolerating amp-sulb   She has gotten passive rabies vaccination; her cat observed >10 days is fine   Plan: No change in plan, if right 4th pip joint remains looking like today and still swollen/red/painful would get mri right hand to r/o osteomyelitis or persistent/recurrent joint infection. The way crp appears/trend is rather reassuring though Continue amp-sulbactam If clinically significant improvement/mri no OM will change unasyn to amox-clav on discharge for 3 weeks total from surgical debridement 4/29     I spent more than 35 minute reviewing data/chart, and coordinating care,  providing direct face to face time providing counseling/discussing diagnostics/treatment plan with patient and treatment team   Principal Problem:   Cat bite of multiple sites of left hand and fingers with infection Active Problems:   Cat bite of right hand including fingers with infection   Allergies  Allergen Reactions   Penicillins Rash    Patient states "all cillins"    Scheduled Meds:  enoxaparin (LOVENOX) injection  40 mg Subcutaneous Q24H   escitalopram  10 mg Oral Daily   [START ON 08/15/2022] rabies vaccine  1 mL Intramuscular Once   Continuous Infusions:  ampicillin-sulbactam (UNASYN) IV 3 g (08/09/22 1152)   PRN Meds:.acetaminophen **OR** acetaminophen, ondansetron **OR** ondansetron (ZOFRAN) IV, oxyCODONE, senna-docusate   SUBJECTIVE: Pain/restricted rom and redness stable Crp normal Afebrile   Review of Systems: ROS All other ROS was negative, except mentioned above     OBJECTIVE: Vitals:   08/08/22 0554 08/08/22 1109 08/08/22 2044 08/09/22 0358  BP: 105/60 118/73 111/73 (!) 100/56  Pulse: (!) 51 64 69 (!) 59  Resp: 17 18 18 20   Temp: 97.8 F (36.6 C) 97.9 F (36.6 C) (!) 97.4 F (36.3 C) 97.6 F (36.4 C)  TempSrc: Oral Oral  Oral  SpO2: 99% 100% 100% 97%  Weight:      Height:       Body mass index is 28.52 kg/m.  Physical Exam  General/constitutional: no distress, pleasant HEENT: Normocephalic, PER, Conj Clear, EOMI, Oropharynx clear Neck supple CV: rrr no mrg Lungs: clear to auscultation, normal respiratory effort Abd: Soft, Nontender Ext: no edema Skin: No Rash Neuro:  nonfocal MSK: stable appearing right hand 4th pip joint swelling/redness and tenderness   Lab Results Lab Results  Component Value Date   WBC 7.0 08/07/2022   HGB 12.4 08/07/2022   HCT 38.3 08/07/2022   MCV 95.8 08/07/2022   PLT 213 08/07/2022    Lab Results  Component Value Date   CREATININE 0.68 08/07/2022   BUN 15 08/07/2022   NA 137 08/07/2022   K  4.0 08/07/2022   CL 106 08/07/2022   CO2 25 08/07/2022    Lab Results  Component Value Date   ALT 17 08/01/2022   AST 27 08/01/2022   ALKPHOS 45 08/01/2022   BILITOT 0.5 08/01/2022      Microbiology: Recent Results (from the past 240 hour(s))  Blood culture (routine x 2)     Status: None   Collection Time: 08/01/22  2:26 PM   Specimen: BLOOD LEFT FOREARM  Result Value Ref Range Status   Specimen Description   Final    BLOOD LEFT FOREARM Performed at [facility], [address] [place], [place] [address]    Special Requests   Final    BOTTLES DRAWN AEROBIC AND ANAEROBIC Blood Culture adequate volume Performed at [facility], [address] [place], [place] [address]    Culture   Final    NO GROWTH 5 DAYS Performed at [facility], [address] [place], [place] [address]    Report Status 08/06/2022 FINAL  Final  Blood culture (routine x 2)     Status: None   Collection Time: 08/01/22  2:26 PM   Specimen: BLOOD  Result Value Ref Range Status   Specimen Description   Final    BLOOD RIGHT ANTECUBITAL Performed at [facility], [address], [place], [place] [address]    Special Requests   Final    BOTTLES DRAWN AEROBIC AND ANAEROBIC Blood Culture results may not be optimal due to an excessive volume of blood received in culture bottles Performed at [facility], [address] [place], [place] [address]    Culture   Final    NO GROWTH 5 DAYS Performed at [facility], [address], [place], [place] [address]    Report Status 08/06/2022 FINAL  Final  Fungus Culture With Stain     Status: None (Preliminary result)   Collection Time: 08/02/22 12:43 AM   Specimen: Hand, Right  Result Value Ref Range Status   Fungus Stain Final report  Final    Comment: (NOTE) Performed At: [facility] [address] [place], [place] [id number] Jolene Schimke MD BM:[id number]    Fungus  (Mycology) Culture PENDING  Incomplete   Fungal Source ABCESS, RIGHT HAND  Final    Comment: Performed at [facility], [address], [place], [place] [address]  Aerobic Culture w Gram Stain (superficial specimen)     Status: None   Collection Time: 08/02/22 12:43 AM   Specimen: Abscess  Result Value Ref Range Status   Specimen Description   Final    ABSCESS RT HAND Performed at [facility], [address] [place], [place] [address]    Special Requests   Final    NONE Performed at [facility], [address], [place], [place] [address]    Gram Stain NO WBC SEEN NO ORGANISMS SEEN   Final   Culture   Final    NO GROWTH 2 DAYS Performed at [facility], [address], [place], [place] [address]    Report Status 08/05/2022 FINAL  Final  Fungus Culture Result     Status: None   Collection Time: 08/02/22 12:43 AM  Result Value Ref Range Status   Result 1 Comment  Final    Comment: (NOTE) KOH/Calcofluor preparation:  no fungus observed. Performed At: [facility] [address] [place], [place] [id number] Jolene Schimke MD XB:[id number]   Gastrointestinal Panel by PCR , Stool     Status: None   Collection Time: 08/05/22  9:27 AM   Specimen: Stool  Result Value Ref Range Status   Campylobacter species NOT DETECTED NOT DETECTED Final   Plesimonas shigelloides NOT DETECTED NOT DETECTED Final   Salmonella species NOT DETECTED NOT DETECTED Final   Yersinia enterocolitica NOT DETECTED NOT DETECTED Final   Vibrio species NOT DETECTED NOT DETECTED Final   Vibrio cholerae NOT DETECTED NOT DETECTED Final   Enteroaggregative E coli (EAEC) NOT DETECTED NOT DETECTED Final   Enteropathogenic E coli (EPEC) NOT DETECTED NOT DETECTED Final   Enterotoxigenic E coli (ETEC) NOT DETECTED NOT DETECTED Final   Shiga like toxin producing E coli (STEC) NOT DETECTED NOT DETECTED Final   Shigella/Enteroinvasive E coli (EIEC)  NOT DETECTED NOT DETECTED Final   Cryptosporidium NOT DETECTED NOT DETECTED Final   Cyclospora cayetanensis NOT DETECTED NOT DETECTED Final   Entamoeba histolytica NOT DETECTED NOT DETECTED Final   Giardia lamblia NOT DETECTED NOT DETECTED Final   Adenovirus F40/41 NOT DETECTED NOT DETECTED Final   Astrovirus NOT DETECTED NOT DETECTED Final   Norovirus GI/GII NOT DETECTED NOT DETECTED Final   Rotavirus A NOT DETECTED NOT DETECTED Final   Sapovirus (I, II, IV, and V) NOT DETECTED NOT DETECTED Final    Comment: Performed at [facility], [address] [place], [place] [address]  Aerobic/Anaerobic Culture w Gram Stain (surgical/deep wound)     Status: None (Preliminary result)   Collection Time: 08/05/22  6:58 PM   Specimen: Wound  Result Value Ref Range Status   Specimen Description   Final    WOUND RIGHT RING FINGER PIP JOINT CULTURE Performed at [facility], [address], [place], [place] [address]    Special Requests   Final    NONE Performed at [facility], [address] [place], [place] [address]    Gram Stain   Final    RARE WBC PRESENT,BOTH PMN AND MONONUCLEAR NO ORGANISMS SEEN    Culture   Final    NO GROWTH 3 DAYS NO ANAEROBES ISOLATED; CULTURE IN PROGRESS FOR 5 DAYS Performed at [facility], [address], [place], [place] [address]    Report Status PENDING  Incomplete  Acid Fast Smear (AFB)     Status: None   Collection Time: 08/05/22  6:58 PM   Specimen: Wound  Result Value Ref Range Status   AFB Specimen Processing Concentration  Final   Acid Fast Smear Negative  Final    Comment: (NOTE) Performed At: [facility] [address] [place], [place] [id number] Jolene Schimke MD DG:[id number]    Source (AFB) WOUND  Final    Comment: RIGHT RING FINGER PIP JOINT  Performed at [facility], [address] [place], [place] [address]   Aerobic/Anaerobic Culture w Gram Stain  (surgical/deep wound)     Status: None (Preliminary result)   Collection Time: 08/05/22  7:15 PM   Specimen: Wound  Result Value  Ref Range Status   Specimen Description   Final    WOUND RIGHT RING FINGER PIP JOINT Performed at [facility], [address], [place], [place] [address]    Special Requests   Final    NONE Performed at [facility], [address], [place], [place] [address]    Gram Stain NO WBC SEEN NO ORGANISMS SEEN   Final   Culture   Final    NO GROWTH 3 DAYS NO ANAEROBES ISOLATED; CULTURE IN PROGRESS FOR 5 DAYS Performed at [facility], [address] [place], [place] [address]    Report Status PENDING  Incomplete  Acid Fast Smear (AFB)     Status: None   Collection Time: 08/05/22  7:15 PM   Specimen: Wound  Result Value Ref Range Status   AFB Specimen Processing Concentration  Final   Acid Fast Smear Negative  Final    Comment: (NOTE) Performed At: [facility] [address] [place], [place] [id number] Jolene Schimke MD IO:[id number]    Source (AFB) WOUND  Final    Comment: RIGHT RING FINGER PIP JOINT  Performed at [facility], [address], [place], [place] [address]      Serology:   Imaging: If present, new imagings (plain films, ct scans, and mri) have been personally visualized and interpreted; radiology reports have been reviewed. Decision making incorporated into the Impression / Recommendations.   Raymondo Band, MD [facility] [phone] pager    08/09/2022, 2:21 PM

## 2022-08-09 NOTE — Progress Notes (Signed)
[facility] PROGRESS NOTE    Progress Note  Yesenia Long  ZOX:[id number] DOB: 1985/09/14 DOA: 08/01/2022 PCP: Collene Mares, PA     Brief Narrative:   Yesenia Long is an 37 y.o. female past medical history significant for anxiety presents to the ED for evaluation of a cat bite   Assessment/Plan:   Cellulitis of multiple sites both hands left shoulder secondary to  Cat bite of multiple sites of left hand and fingers with infection status post I&D by hand surgeon: Satus post I&D in the OR on 08/05/2022 Started initially on Rocephin and Flagyl and oral azithromycin on admission to cover for scratch disease. Blood cultures have been negative to 2. 4 doses of the rabies vaccine over 14 days (day 0, 3, 7 and 14) received immunoglobulin and 1st rabies vaccine on 4/25, 2nd dose on 4/28, next doses will be on 5/2 and 5/9 to complete the series  Awaiting cultures to tailor antibiotics. Consulted ID who recommended to monitor on IV antibiotics over the weekend repeat an MRI of the right hand on Monday to see if the bone is involved.  Antibiotics changed to IV Unasyn If MRI negative will need amoxicillin for 3 weeks.  History of anxiety: Continue Lexapro.   DVT prophylaxis: lovenox Family Communication:none Status is: Inpatient Remains inpatient appropriate because: Cellulitis of multiple sites    Code Status:     Code Status Orders  (From admission, onward)           Start     Ordered   08/01/22 1908  Full code  Continuous       Question:  By:  Answer:  Consent: discussion documented in EHR   08/01/22 1908           Code Status History     This patient has a current code status but no historical code status.         IV Access:   Peripheral IV   Procedures and diagnostic studies:   No results found.   Medical Consultants:   None.   Subjective:    Pebbles Nichol relates of no numbness in her third finger medial side  Objective:     Vitals:   08/08/22 0554 08/08/22 1109 08/08/22 2044 08/09/22 0358  BP: 105/60 118/73 111/73 (!) 100/56  Pulse: (!) 51 64 69 (!) 59  Resp: 17 18 18 20   Temp: 97.8 F (36.6 C) 97.9 F (36.6 C) (!) 97.4 F (36.3 C) 97.6 F (36.4 C)  TempSrc: Oral Oral  Oral  SpO2: 99% 100% 100% 97%  Weight:      Height:       SpO2: 97 %   Intake/Output Summary (Last 24 hours) at 08/09/2022 1023 Last data filed at 08/09/2022 0402 Gross per 24 hour  Intake 678 ml  Output --  Net 678 ml    Filed Weights   08/01/22 1134 08/01/22 1323 08/05/22 1823  Weight: 82.6 kg 82.6 kg 82.6 kg    Exam: General exam: In no acute distress. Respiratory system: Good air movement and clear to auscultation. Cardiovascular system: S1 & S2 heard, RRR. No JVD. Gastrointestinal system: Abdomen is nondistended, soft and nontender.  Extremities: No pedal edema. Skin: No rashes, lesions or ulcers Psychiatry: Judgement and insight appear normal. Mood & affect appropriate. Data Reviewed:    Labs: Basic Metabolic Panel: Recent Labs  Lab 08/03/22 0652 08/06/22 0555 08/07/22 0537  NA 136 134* 137  K 4.2 4.3 4.0  CL 107 102  106  CO2 21* 23 25  GLUCOSE 81 151* 83  BUN 12 15 15   CREATININE 0.55 0.81 0.68  CALCIUM 8.1* 8.4* 8.3*    GFR Estimated Creatinine Clearance: 104.3 mL/min (by C-G formula based on SCr of 0.68 mg/dL). Liver Function Tests: No results for input(s): "AST", "ALT", "ALKPHOS", "BILITOT", "PROT", "ALBUMIN" in the last 168 hours.  No results for input(s): "LIPASE", "AMYLASE" in the last 168 hours. No results for input(s): "AMMONIA" in the last 168 hours. Coagulation profile No results for input(s): "INR", "PROTIME" in the last 168 hours. COVID-19 Labs  Recent Labs    08/08/22 1222  CRP 0.8    No results found for: "SARSCOV2NAA"  CBC: Recent Labs  Lab 08/03/22 0652 08/05/22 0556 08/06/22 0555 08/07/22 0537  WBC 4.1 5.2 8.1 7.0  NEUTROABS 2.0  --   --   --   HGB 12.3 13.0  14.0 12.4  HCT 38.0 40.4 42.7 38.3  MCV 98.7 98.1 93.6 95.8  PLT 151 188 264 213    Cardiac Enzymes: No results for input(s): "CKTOTAL", "CKMB", "CKMBINDEX", "TROPONINI" in the last 168 hours. BNP (last 3 results) No results for input(s): "PROBNP" in the last 8760 hours. CBG: No results for input(s): "GLUCAP" in the last 168 hours. D-Dimer: No results for input(s): "DDIMER" in the last 72 hours. Hgb A1c: No results for input(s): "HGBA1C" in the last 72 hours. Lipid Profile: No results for input(s): "CHOL", "HDL", "LDLCALC", "TRIG", "CHOLHDL", "LDLDIRECT" in the last 72 hours. Thyroid function studies: No results for input(s): "TSH", "T4TOTAL", "T3FREE", "THYROIDAB" in the last 72 hours.  Invalid input(s): "FREET3" Anemia work up: No results for input(s): "VITAMINB12", "FOLATE", "FERRITIN", "TIBC", "IRON", "RETICCTPCT" in the last 72 hours. Sepsis Labs: Recent Labs  Lab 08/03/22 0652 08/05/22 0556 08/06/22 0555 08/07/22 0537  WBC 4.1 5.2 8.1 7.0    Microbiology Recent Results (from the past 240 hour(s))  Blood culture (routine x 2)     Status: None   Collection Time: 08/01/22  2:26 PM   Specimen: BLOOD LEFT FOREARM  Result Value Ref Range Status   Specimen Description   Final    BLOOD LEFT FOREARM Performed at [facility], [address], [place], [place] [address]    Special Requests   Final    BOTTLES DRAWN AEROBIC AND ANAEROBIC Blood Culture adequate volume Performed at [facility], [address] [place], [place] [address]    Culture   Final    NO GROWTH 5 DAYS Performed at [facility], [address] [place], [place] [address]    Report Status 08/06/2022 FINAL  Final  Blood culture (routine x 2)     Status: None   Collection Time: 08/01/22  2:26 PM   Specimen: BLOOD  Result Value Ref Range Status   Specimen Description   Final    BLOOD RIGHT ANTECUBITAL Performed at [facility], [address] [place], [place] [address]    Special Requests   Final    BOTTLES DRAWN AEROBIC AND ANAEROBIC Blood Culture results may not be optimal due to an excessive volume of blood received in culture bottles Performed at [facility], [address] [place], [place] [address]    Culture   Final    NO GROWTH 5 DAYS Performed at [facility], [address], [place], [place] [address]    Report Status 08/06/2022 FINAL  Final  Fungus Culture With Stain  Status: None (Preliminary result)   Collection Time: 08/02/22 12:43 AM   Specimen: Hand, Right  Result Value Ref Range Status   Fungus Stain Final report  Final    Comment: (NOTE) Performed At: [facility] [address] [place], [place] [id number] Jolene Schimke MD [id number]    Fungus (Mycology) Culture PENDING  Incomplete   Fungal Source ABCESS, RIGHT HAND  Final    Comment: Performed at [facility], [address], [place], [place] [address]  Aerobic Culture w Gram Stain (superficial specimen)     Status: None   Collection Time: 08/02/22 12:43 AM   Specimen: Abscess  Result Value Ref Range Status   Specimen Description   Final    ABSCESS RT HAND Performed at [facility], [address] [place], [place] [address]    Special Requests   Final    NONE Performed at [facility], [address], [place], [place] [address]    Gram Stain NO WBC SEEN NO ORGANISMS SEEN   Final   Culture   Final    NO GROWTH 2 DAYS Performed at [facility], [address] [place], [place] [address]    Report Status 08/05/2022 FINAL  Final  Fungus Culture Result     Status: None   Collection Time: 08/02/22 12:43 AM  Result Value Ref Range Status   Result 1 Comment  Final    Comment: (NOTE) KOH/Calcofluor preparation:  no fungus observed. Performed At: [facility] [address] [place], [place] [id number] Jolene Schimke MD  [id number]   Gastrointestinal Panel by PCR , Stool     Status: None   Collection Time: 08/05/22  9:27 AM   Specimen: Stool  Result Value Ref Range Status   Campylobacter species NOT DETECTED NOT DETECTED Final   Plesimonas shigelloides NOT DETECTED NOT DETECTED Final   Salmonella species NOT DETECTED NOT DETECTED Final   Yersinia enterocolitica NOT DETECTED NOT DETECTED Final   Vibrio species NOT DETECTED NOT DETECTED Final   Vibrio cholerae NOT DETECTED NOT DETECTED Final   Enteroaggregative E coli (EAEC) NOT DETECTED NOT DETECTED Final   Enteropathogenic E coli (EPEC) NOT DETECTED NOT DETECTED Final   Enterotoxigenic E coli (ETEC) NOT DETECTED NOT DETECTED Final   Shiga like toxin producing E coli (STEC) NOT DETECTED NOT DETECTED Final   Shigella/Enteroinvasive E coli (EIEC) NOT DETECTED NOT DETECTED Final   Cryptosporidium NOT DETECTED NOT DETECTED Final   Cyclospora cayetanensis NOT DETECTED NOT DETECTED Final   Entamoeba histolytica NOT DETECTED NOT DETECTED Final   Giardia lamblia NOT DETECTED NOT DETECTED Final   Adenovirus F40/41 NOT DETECTED NOT DETECTED Final   Astrovirus NOT DETECTED NOT DETECTED Final   Norovirus GI/GII NOT DETECTED NOT DETECTED Final   Rotavirus A NOT DETECTED NOT DETECTED Final   Sapovirus (I, II, IV, and V) NOT DETECTED NOT DETECTED Final    Comment: Performed at [facility], [address] [place], [place] [address]  Aerobic/Anaerobic Culture w Gram Stain (surgical/deep wound)     Status: None (Preliminary result)   Collection Time: 08/05/22  6:58 PM   Specimen: Wound  Result Value Ref Range Status   Specimen Description   Final    WOUND RIGHT RING FINGER PIP JOINT CULTURE Performed at [facility], [address], [place], [place] [address]    Special Requests   Final    NONE Performed at [facility], [address] [place], [place] [address]    Gram Stain   Final    RARE WBC PRESENT,BOTH  PMN AND MONONUCLEAR NO ORGANISMS SEEN    Culture   Final    NO GROWTH 3 DAYS NO ANAEROBES ISOLATED; CULTURE IN PROGRESS FOR 5 DAYS Performed at [facility], [address], [place], [place] [address]    Report Status PENDING  Incomplete  Acid Fast Smear (AFB)     Status: None   Collection Time: 08/05/22  6:58 PM   Specimen: Wound  Result Value Ref Range Status   AFB Specimen Processing Concentration  Final   Acid Fast Smear Negative  Final    Comment: (NOTE) Performed At: [facility] [address] [place], [place] [id number] Jolene Schimke MD [id number]    Source (AFB) WOUND  Final    Comment: RIGHT RING FINGER PIP JOINT  Performed at [facility], [address] [place], [place] [address]   Aerobic/Anaerobic Culture w Gram Stain (surgical/deep wound)     Status: None (Preliminary result)   Collection Time: 08/05/22  7:15 PM   Specimen: Wound  Result Value Ref Range Status   Specimen Description   Final    WOUND RIGHT RING FINGER PIP JOINT Performed at [facility], [address] [place], [place] [address]    Special Requests   Final    NONE Performed at [facility], [address], [place], [place] [address]    Gram Stain NO WBC SEEN NO ORGANISMS SEEN   Final   Culture   Final    NO GROWTH 3 DAYS NO ANAEROBES ISOLATED; CULTURE IN PROGRESS FOR 5 DAYS Performed at [facility], [address], [place], [place] [address]    Report Status PENDING  Incomplete  Acid Fast Smear (AFB)     Status: None   Collection Time: 08/05/22  7:15 PM   Specimen: Wound  Result Value Ref Range Status   AFB Specimen Processing Concentration  Final   Acid Fast Smear Negative  Final    Comment: (NOTE) Performed At: [facility] [address] [place], [place] [id number] Jolene Schimke MD [id number]    Source (AFB) WOUND  Final    Comment: RIGHT RING FINGER PIP JOINT  Performed at [facility], [address], [place], [place] [address]      Medications:    enoxaparin (LOVENOX) injection  40 mg Subcutaneous Q24H   escitalopram  10 mg Oral Daily   Continuous Infusions:  ampicillin-sulbactam (UNASYN) IV 3 g (08/09/22 0517)      LOS: 8 days   Marinda Elk  [facility]  08/09/2022, 10:23 AM

## 2022-08-10 DIAGNOSIS — W5501XD Bitten by cat, subsequent encounter: Secondary | ICD-10-CM | POA: Diagnosis not present

## 2022-08-10 DIAGNOSIS — L089 Local infection of the skin and subcutaneous tissue, unspecified: Secondary | ICD-10-CM | POA: Diagnosis not present

## 2022-08-10 DIAGNOSIS — S61452D Open bite of left hand, subsequent encounter: Secondary | ICD-10-CM

## 2022-08-10 DIAGNOSIS — S61259D Open bite of unspecified finger without damage to nail, subsequent encounter: Secondary | ICD-10-CM

## 2022-08-10 LAB — C-REACTIVE PROTEIN: CRP: 1.4 mg/dL — ABNORMAL HIGH (ref <1.0)

## 2022-08-10 LAB — AEROBIC/ANAEROBIC CULTURE W GRAM STAIN (SURGICAL/DEEP WOUND)

## 2022-08-10 NOTE — Progress Notes (Signed)
[facility] PROGRESS NOTE    Progress Note  Yesenia Long  ZOX:[id number] DOB: 10-Aug-1985 DOA: 08/01/2022 PCP: Collene Mares, PA     Brief Narrative:   Yesenia Long is an 36 y.o. female past medical history significant for anxiety presents to the ED for evaluation of a cat bite   Assessment/Plan:   Cellulitis of multiple sites both hands left shoulder secondary to  Cat bite of multiple sites of left hand and fingers with infection status post I&D by hand surgeon: Satus post I&D in the OR on 08/05/2022 Blood cultures have been negative to 2. 4 doses of the rabies vaccine over 14 days (day 0, 3, 7 and 14) received immunoglobulin and 1st rabies vaccine on 4/25, 2nd dose on 4/28, next doses will be on 5/2 and 5/9 to complete the series  Awaiting cultures to tailor antibiotics. Consulted ID who recommended to monitor on IV antibiotics over the weekend repeat an MRI of the right hand on Monday to see if the bone is involved.  Continue IV Unasyn  If MRI negative, will need amoxicillin for 3 weeks.  History of anxiety: Continue Lexapro.   DVT prophylaxis: lovenox Family Communication:none Status is: Inpatient Remains inpatient appropriate because: Cellulitis of multiple sites    Code Status:     Code Status Orders  (From admission, onward)           Start     Ordered   08/01/22 1908  Full code  Continuous       Question:  By:  Answer:  Consent: discussion documented in EHR   08/01/22 1908           Code Status History     This patient has a current code status but no historical code status.         IV Access:   Peripheral IV   Procedures and diagnostic studies:   No results found.   Medical Consultants:   None.   Subjective:    Unk Pinto  third finger medial side.  Objective:    Vitals:   08/09/22 0358 08/09/22 1433 08/09/22 2020 08/10/22 0436  BP: (!) 100/56 123/78 121/70 (!) 100/56  Pulse: (!) 59 72 82 64  Resp:  20 20 18 16   Temp: 97.6 F (36.4 C) 97.8 F (36.6 C) 97.9 F (36.6 C) 97.9 F (36.6 C)  TempSrc: Oral  Oral Oral  SpO2: 97% 99% 98% 97%  Weight:      Height:       SpO2: 97 %   Intake/Output Summary (Last 24 hours) at 08/10/2022 0945 Last data filed at 08/10/2022 8119 Gross per 24 hour  Intake 1460 ml  Output --  Net 1460 ml    Filed Weights   08/01/22 1134 08/01/22 1323 08/05/22 1823  Weight: 82.6 kg 82.6 kg 82.6 kg    Exam: General exam: In no acute distress. Respiratory system: Good air movement and clear to auscultation. Cardiovascular system: S1 & S2 heard, RRR. No JVD. Gastrointestinal system: Abdomen is nondistended, soft and nontender.  Extremities: No pedal edema. Skin: No rashes, lesions or ulcers Psychiatry: Judgement and insight appear normal. Mood & affect appropriate. Data Reviewed:    Labs: Basic Metabolic Panel: Recent Labs  Lab 08/06/22 0555 08/07/22 0537  NA 134* 137  K 4.3 4.0  CL 102 106  CO2 23 25  GLUCOSE 151* 83  BUN 15 15  CREATININE 0.81 0.68  CALCIUM 8.4* 8.3*    GFR Estimated Creatinine Clearance:  104.3 mL/min (by C-G formula based on SCr of 0.68 mg/dL). Liver Function Tests: No results for input(s): "AST", "ALT", "ALKPHOS", "BILITOT", "PROT", "ALBUMIN" in the last 168 hours.  No results for input(s): "LIPASE", "AMYLASE" in the last 168 hours. No results for input(s): "AMMONIA" in the last 168 hours. Coagulation profile No results for input(s): "INR", "PROTIME" in the last 168 hours. COVID-19 Labs  Recent Labs    08/08/22 1222 08/09/22 0634  CRP 0.8 <0.5     No results found for: "SARSCOV2NAA"  CBC: Recent Labs  Lab 08/05/22 0556 08/06/22 0555 08/07/22 0537  WBC 5.2 8.1 7.0  HGB 13.0 14.0 12.4  HCT 40.4 42.7 38.3  MCV 98.1 93.6 95.8  PLT 188 264 213    Cardiac Enzymes: No results for input(s): "CKTOTAL", "CKMB", "CKMBINDEX", "TROPONINI" in the last 168 hours. BNP (last 3 results) No results for input(s):  "PROBNP" in the last 8760 hours. CBG: No results for input(s): "GLUCAP" in the last 168 hours. D-Dimer: No results for input(s): "DDIMER" in the last 72 hours. Hgb A1c: No results for input(s): "HGBA1C" in the last 72 hours. Lipid Profile: No results for input(s): "CHOL", "HDL", "LDLCALC", "TRIG", "CHOLHDL", "LDLDIRECT" in the last 72 hours. Thyroid function studies: No results for input(s): "TSH", "T4TOTAL", "T3FREE", "THYROIDAB" in the last 72 hours.  Invalid input(s): "FREET3" Anemia work up: No results for input(s): "VITAMINB12", "FOLATE", "FERRITIN", "TIBC", "IRON", "RETICCTPCT" in the last 72 hours. Sepsis Labs: Recent Labs  Lab 08/05/22 0556 08/06/22 0555 08/07/22 0537  WBC 5.2 8.1 7.0    Microbiology Recent Results (from the past 240 hour(s))  Blood culture (routine x 2)     Status: None   Collection Time: 08/01/22  2:26 PM   Specimen: BLOOD LEFT FOREARM  Result Value Ref Range Status   Specimen Description   Final    BLOOD LEFT FOREARM Performed at [facility], [address] [place], [place] [address]    Special Requests   Final    BOTTLES DRAWN AEROBIC AND ANAEROBIC Blood Culture adequate volume Performed at [facility], [address] [place], [place] [address]    Culture   Final    NO GROWTH 5 DAYS Performed at [facility], [address], [place], [place] [address]    Report Status 08/06/2022 FINAL  Final  Blood culture (routine x 2)     Status: None   Collection Time: 08/01/22  2:26 PM   Specimen: BLOOD  Result Value Ref Range Status   Specimen Description   Final    BLOOD RIGHT ANTECUBITAL Performed at [facility], [address] [place], [place] [address]    Special Requests   Final    BOTTLES DRAWN AEROBIC AND ANAEROBIC Blood Culture results may not be optimal due to an excessive volume of blood received in culture bottles Performed at [facility], [address], [place], [place] [address]    Culture   Final    NO GROWTH 5 DAYS Performed at [facility], [address] [place], [place] [address]    Report Status 08/06/2022 FINAL  Final  Fungus Culture With Stain     Status: None (Preliminary result)   Collection Time: 08/02/22 12:43 AM   Specimen: Hand, Right  Result Value Ref Range Status   Fungus Stain Final report  Final    Comment: (NOTE) Performed At: [facility] [address] [place], [place] [id number] Jolene Schimke MD BM:[id number]  Fungus (Mycology) Culture PENDING  Incomplete   Fungal Source ABCESS, RIGHT HAND  Final    Comment: Performed at [facility], [address] [place], [place] [address]  Aerobic Culture w Gram Stain (superficial specimen)     Status: None   Collection Time: 08/02/22 12:43 AM   Specimen: Abscess  Result Value Ref Range Status   Specimen Description   Final    ABSCESS RT HAND Performed at [facility], [address] [place], [place] [address]    Special Requests   Final    NONE Performed at [facility], [address], [place], [place] [address]    Gram Stain NO WBC SEEN NO ORGANISMS SEEN   Final   Culture   Final    NO GROWTH 2 DAYS Performed at [facility], [address], [place], [place] [address]    Report Status 08/05/2022 FINAL  Final  Fungus Culture Result     Status: None   Collection Time: 08/02/22 12:43 AM  Result Value Ref Range Status   Result 1 Comment  Final    Comment: (NOTE) KOH/Calcofluor preparation:  no fungus observed. Performed At: [facility] [address] [place], [place] [id number] Jolene Schimke MD MV:[id number]   Gastrointestinal Panel by PCR , Stool     Status: None   Collection Time: 08/05/22  9:27 AM   Specimen: Stool  Result Value Ref Range Status   Campylobacter species NOT DETECTED NOT DETECTED Final   Plesimonas shigelloides NOT DETECTED NOT DETECTED  Final   Salmonella species NOT DETECTED NOT DETECTED Final   Yersinia enterocolitica NOT DETECTED NOT DETECTED Final   Vibrio species NOT DETECTED NOT DETECTED Final   Vibrio cholerae NOT DETECTED NOT DETECTED Final   Enteroaggregative E coli (EAEC) NOT DETECTED NOT DETECTED Final   Enteropathogenic E coli (EPEC) NOT DETECTED NOT DETECTED Final   Enterotoxigenic E coli (ETEC) NOT DETECTED NOT DETECTED Final   Shiga like toxin producing E coli (STEC) NOT DETECTED NOT DETECTED Final   Shigella/Enteroinvasive E coli (EIEC) NOT DETECTED NOT DETECTED Final   Cryptosporidium NOT DETECTED NOT DETECTED Final   Cyclospora cayetanensis NOT DETECTED NOT DETECTED Final   Entamoeba histolytica NOT DETECTED NOT DETECTED Final   Giardia lamblia NOT DETECTED NOT DETECTED Final   Adenovirus F40/41 NOT DETECTED NOT DETECTED Final   Astrovirus NOT DETECTED NOT DETECTED Final   Norovirus GI/GII NOT DETECTED NOT DETECTED Final   Rotavirus A NOT DETECTED NOT DETECTED Final   Sapovirus (I, II, IV, and V) NOT DETECTED NOT DETECTED Final    Comment: Performed at [facility], [address] [place], [place] [address]  Fungus Culture With Stain     Status: None (Preliminary result)   Collection Time: 08/05/22  6:58 PM   Specimen: Wound  Result Value Ref Range Status   Fungus Stain Final report  Final    Comment: (NOTE) Performed At: [facility] [address] [place], [place] [id number] Jolene Schimke MD [id number]    Fungus (Mycology) Culture PENDING  Incomplete   Fungal Source WOUND  Final    Comment: RIGHT RING FINGER PIP JOINT  Performed at [facility], [address], [place], [place] [address]   Aerobic/Anaerobic Culture w Gram Stain (surgical/deep wound)     Status: None (Preliminary result)   Collection Time: 08/05/22  6:58 PM   Specimen: Wound  Result Value Ref Range Status   Specimen Description  Final    WOUND RIGHT RING FINGER PIP JOINT  CULTURE Performed at [facility], [address], [place], [place] [address]    Special Requests   Final    NONE Performed at [facility], [address], [place], [place] [address]    Gram Stain   Final    RARE WBC PRESENT,BOTH PMN AND MONONUCLEAR NO ORGANISMS SEEN    Culture   Final    NO GROWTH 3 DAYS NO ANAEROBES ISOLATED; CULTURE IN PROGRESS FOR 5 DAYS Performed at [facility], [address] [place], [place] [address]    Report Status PENDING  Incomplete  Acid Fast Smear (AFB)     Status: None   Collection Time: 08/05/22  6:58 PM   Specimen: Wound  Result Value Ref Range Status   AFB Specimen Processing Concentration  Final   Acid Fast Smear Negative  Final    Comment: (NOTE) Performed At: [facility] [address] [place], [place] [id number] Jolene Schimke MD [id number]    Source (AFB) WOUND  Final    Comment: RIGHT RING FINGER PIP JOINT  Performed at [facility], [address], [place], [place] [address]   Fungus Culture Result     Status: None   Collection Time: 08/05/22  6:58 PM  Result Value Ref Range Status   Result 1 Comment  Final    Comment: (NOTE) KOH/Calcofluor preparation:  no fungus observed. Performed At: [facility] [address] [place], [place] [id number] Jolene Schimke MD [id number]   Fungus Culture With Stain     Status: None (Preliminary result)   Collection Time: 08/05/22  7:15 PM   Specimen: Wound  Result Value Ref Range Status   Fungus Stain Final report  Final    Comment: (NOTE) Performed At: [facility] [address] [place], [place] [id number] Jolene Schimke MD [id number]:[id number]    Fungus (Mycology) Culture PENDING  Incomplete   Fungal Source WOUND  Final    Comment: RIGHT RING FINGER PIP JOINT  Performed at [facility], [address], [place], [place] [address]   Aerobic/Anaerobic Culture w Gram Stain  (surgical/deep wound)     Status: None (Preliminary result)   Collection Time: 08/05/22  7:15 PM   Specimen: Wound  Result Value Ref Range Status   Specimen Description   Final    WOUND RIGHT RING FINGER PIP JOINT Performed at [facility], [address], [place], [place] [address]    Special Requests   Final    NONE Performed at [facility], [address] [place], [place] [address]    Gram Stain NO WBC SEEN NO ORGANISMS SEEN   Final   Culture   Final    NO GROWTH 3 DAYS NO ANAEROBES ISOLATED; CULTURE IN PROGRESS FOR 5 DAYS Performed at [facility], [address] [place], [place] [address]    Report Status PENDING  Incomplete  Acid Fast Smear (AFB)     Status: None   Collection Time: 08/05/22  7:15 PM   Specimen: Wound  Result Value Ref Range Status   AFB Specimen Processing Concentration  Final   Acid Fast Smear Negative  Final    Comment: (NOTE) Performed At: [facility] [address] [place], [place] [id number] Jolene Schimke MD [id number]    Source (AFB) WOUND  Final    Comment: RIGHT RING FINGER PIP JOINT  Performed at [facility], [address] [place], [place] [address]   Fungus Culture Result     Status: None   Collection Time: 08/05/22  7:15 PM  Result Value Ref Range Status   Result 1 Comment  Final    Comment: (NOTE) KOH/Calcofluor preparation:  no fungus observed. Performed At: [facility] [address] [place], [place] [id number] Jolene Schimke MD [id number]:[id number]      Medications:    enoxaparin (LOVENOX) injection  40 mg Subcutaneous Q24H   escitalopram  10 mg Oral Daily   [START ON 08/15/2022] rabies vaccine  1 mL Intramuscular Once   Continuous Infusions:  ampicillin-sulbactam (UNASYN) IV Stopped (08/10/22 0550)      LOS: 9 days   Marinda Elk  [facility]  08/10/2022, 9:45 AM

## 2022-08-10 NOTE — Progress Notes (Signed)
Ortho Progress Note  S: Patient doing fairly well.  Reports some improvement in pain and redness.  ID consulted.  Antibiotics Unasyn.  CRP and WBC normal.  Intra-op cultures no growth to date.  Patient reports tingling on radial aspect of right long finger.  O:  Right hand Incisions healing without drainage, sutures intact.  Dorsal ring finger erythema improved.  No fusiform swelling.  Tender to palpation along dorsal ring finger proximal phalanx, PIP, and middle phalanx.  Ring finger ROM minimal improvement, 20 deg of flexion to 75 deg flexion.  Patient reports intact sensation but with tingling on radial aspect of long finger, intact on radial and ulnar aspects of ring finger.  A/P: 52F with cat bites s/p bedside I&D of right ring PIP joint on 4/26 then OR I&D of right ring and right long finger PIP joints on 4/29.  No concern for flexor tenosynovitis.  Unlikely to have persistent septic arthritis or new osteomyelitis based on intra-op findings, cultures, and CRP trend.  Patient is slowly improving.  Recommend Antibacterial soap soaks BID, elevation of RUE.  Antibiotics per ID.    If MRI is obtained per ID rec, it may be confounding due to post operative changes that can often appear as abscess or other infection due to swelling, etc.  I think it may be reasonable to get the MRI if only looking for bony edema or other signs of osteomyelitis but I do not think MRI evaluation of the soft tissue infection would be accurate due to recent surgery.   Shaune Pollack, MD Hand & Upper Extremity Surgery [facility] [place] [id number]

## 2022-08-11 DIAGNOSIS — L03119 Cellulitis of unspecified part of limb: Secondary | ICD-10-CM

## 2022-08-11 DIAGNOSIS — S61452D Open bite of left hand, subsequent encounter: Secondary | ICD-10-CM | POA: Diagnosis not present

## 2022-08-11 DIAGNOSIS — S61259D Open bite of unspecified finger without damage to nail, subsequent encounter: Secondary | ICD-10-CM | POA: Diagnosis not present

## 2022-08-11 DIAGNOSIS — L089 Local infection of the skin and subcutaneous tissue, unspecified: Secondary | ICD-10-CM | POA: Diagnosis not present

## 2022-08-11 LAB — ACID FAST SMEAR (AFB, MYCOBACTERIA): Acid Fast Smear: NEGATIVE

## 2022-08-11 LAB — C-REACTIVE PROTEIN: CRP: <0.5 mg/dL (ref <1.0)

## 2022-08-11 LAB — AEROBIC/ANAEROBIC CULTURE W GRAM STAIN (SURGICAL/DEEP WOUND): Culture: NO GROWTH

## 2022-08-11 NOTE — Progress Notes (Signed)
[facility] PROGRESS NOTE    Progress Note  Yesenia Long  GNF:[id number] DOB: [birth year] DOA: 08/01/2022 PCP: Collene Mares, PA     Brief Narrative:   Yesenia Long is an 37 y.o. female past medical history significant for anxiety presents to the ED for evaluation of a cat bite   Assessment/Plan:   Cellulitis of multiple sites both hands left shoulder secondary to  Cat bite of multiple sites of left hand and fingers with infection status post I&D by hand surgeon: Satus post I&D in the OR on 08/05/2022 Blood cultures have been negative to 2. 4 doses of the rabies vaccine over 14 days (day 0, 3, 7 and 14) received immunoglobulin and 1st rabies vaccine on 4/25, 2nd dose on 4/28, next doses will be on 5/2 and 5/9 to complete the series  Consulted ID who recommended to monitor on IV antibiotics (Unasyn) over the weekend repeat an MRI of the right hand on Monday to see if the bone is involved.  Continue IV Unasyn  If MRI negative, will need amoxicillin for 3 weeks.  History of anxiety: Continue Lexapro.   DVT prophylaxis: lovenox Family Communication:none Status is: Inpatient Remains inpatient appropriate because: Cellulitis of multiple sites    Code Status:     Code Status Orders  (From admission, onward)           Start     Ordered   08/01/22 1908  Full code  Continuous       Question:  By:  Answer:  Consent: discussion documented in EHR   08/01/22 1908           Code Status History     This patient has a current code status but no historical code status.         IV Access:   Peripheral IV   Procedures and diagnostic studies:   No results found.   Medical Consultants:   None.   Subjective:    Chazz Demke she has no pain no complaints.  Objective:    Vitals:   08/09/22 2020 08/10/22 0436 08/10/22 1301 08/11/22 0545  BP: 121/70 (!) 100/56 110/77 (!) 93/54  Pulse: 82 64 85 (!) 58  Resp: 18 16 20 17   Temp: 97.9 F  (36.6 C) 97.9 F (36.6 C) 98.2 F (36.8 C) 97.7 F (36.5 C)  TempSrc: Oral Oral Oral Oral  SpO2: 98% 97% 98% 98%  Weight:      Height:       SpO2: 98 %   Intake/Output Summary (Last 24 hours) at 08/11/2022 0943 Last data filed at 08/10/2022 1833 Gross per 24 hour  Intake 892.91 ml  Output --  Net 892.91 ml    Filed Weights   08/01/22 1134 08/01/22 1323 08/05/22 1823  Weight: 82.6 kg 82.6 kg 82.6 kg    Exam: General exam: In no acute distress. Respiratory system: Good air movement and clear to auscultation. Cardiovascular system: S1 & S2 heard, RRR. No JVD. Gastrointestinal system: Abdomen is nondistended, soft and nontender.  Extremities: No pedal edema. Skin: No rashes, lesions or ulcers Psychiatry: Judgement and insight appear normal. Mood & affect appropriate. Data Reviewed:    Labs: Basic Metabolic Panel: Recent Labs  Lab 08/06/22 0555 08/07/22 0537  NA 134* 137  K 4.3 4.0  CL 102 106  CO2 23 25  GLUCOSE 151* 83  BUN 15 15  CREATININE 0.81 0.68  CALCIUM 8.4* 8.3*    GFR Estimated Creatinine Clearance: 104.3 mL/min (by  C-G formula based on SCr of 0.68 mg/dL). Liver Function Tests: No results for input(s): "AST", "ALT", "ALKPHOS", "BILITOT", "PROT", "ALBUMIN" in the last 168 hours.  No results for input(s): "LIPASE", "AMYLASE" in the last 168 hours. No results for input(s): "AMMONIA" in the last 168 hours. Coagulation profile No results for input(s): "INR", "PROTIME" in the last 168 hours. COVID-19 Labs  Recent Labs    08/08/22 1222 08/09/22 0634 08/10/22 1229  CRP 0.8 <0.5 1.4*     No results found for: "SARSCOV2NAA"  CBC: Recent Labs  Lab 08/05/22 0556 08/06/22 0555 08/07/22 0537  WBC 5.2 8.1 7.0  HGB 13.0 14.0 12.4  HCT 40.4 42.7 38.3  MCV 98.1 93.6 95.8  PLT 188 264 213    Cardiac Enzymes: No results for input(s): "CKTOTAL", "CKMB", "CKMBINDEX", "TROPONINI" in the last 168 hours. BNP (last 3 results) No results for input(s):  "PROBNP" in the last 8760 hours. CBG: No results for input(s): "GLUCAP" in the last 168 hours. D-Dimer: No results for input(s): "DDIMER" in the last 72 hours. Hgb A1c: No results for input(s): "HGBA1C" in the last 72 hours. Lipid Profile: No results for input(s): "CHOL", "HDL", "LDLCALC", "TRIG", "CHOLHDL", "LDLDIRECT" in the last 72 hours. Thyroid function studies: No results for input(s): "TSH", "T4TOTAL", "T3FREE", "THYROIDAB" in the last 72 hours.  Invalid input(s): "FREET3" Anemia work up: No results for input(s): "VITAMINB12", "FOLATE", "FERRITIN", "TIBC", "IRON", "RETICCTPCT" in the last 72 hours. Sepsis Labs: Recent Labs  Lab 08/05/22 0556 08/06/22 0555 08/07/22 0537  WBC 5.2 8.1 7.0    Microbiology Recent Results (from the past 240 hour(s))  Blood culture (routine x 2)     Status: None   Collection Time: 08/01/22  2:26 PM   Specimen: BLOOD LEFT FOREARM  Result Value Ref Range Status   Specimen Description   Final    BLOOD LEFT FOREARM Performed at [facility], [address], [place], [place] [address]    Special Requests   Final    BOTTLES DRAWN AEROBIC AND ANAEROBIC Blood Culture adequate volume Performed at [facility], [address] [place], [place] [address]    Culture   Final    NO GROWTH 5 DAYS Performed at [facility], [address], [place], [place] [address]    Report Status 08/06/2022 FINAL  Final  Blood culture (routine x 2)     Status: None   Collection Time: 08/01/22  2:26 PM   Specimen: BLOOD  Result Value Ref Range Status   Specimen Description   Final    BLOOD RIGHT ANTECUBITAL Performed at [facility], [address], [place], [place] [address]    Special Requests   Final    BOTTLES DRAWN AEROBIC AND ANAEROBIC Blood Culture results may not be optimal due to an excessive volume of blood received in culture bottles Performed at [facility], [address], [place], [place] [address]    Culture   Final    NO GROWTH 5 DAYS Performed at [facility], [address] [place], [place] [address]    Report Status 08/06/2022 FINAL  Final  Fungus Culture With Stain     Status: None (Preliminary result)   Collection Time: 08/02/22 12:43 AM   Specimen: Hand, Right  Result Value Ref Range Status   Fungus Stain Final report  Final    Comment: (NOTE) Performed At: [facility] [address] [place], [place] [id number] Jolene Schimke MD WN:[id number]  Fungus (Mycology) Culture PENDING  Incomplete   Fungal Source ABCESS, RIGHT HAND  Final    Comment: Performed at [facility], [address] [place], [place] [address]  Aerobic Culture w Gram Stain (superficial specimen)     Status: None   Collection Time: 08/02/22 12:43 AM   Specimen: Abscess  Result Value Ref Range Status   Specimen Description   Final    ABSCESS RT HAND Performed at [facility], [address] [place], [place] [address]    Special Requests   Final    NONE Performed at [facility], [address], [place], [place] [address]    Gram Stain NO WBC SEEN NO ORGANISMS SEEN   Final   Culture   Final    NO GROWTH 2 DAYS Performed at [facility], [address], [place], [place] [address]    Report Status 08/05/2022 FINAL  Final  Fungus Culture Result     Status: None   Collection Time: 08/02/22 12:43 AM  Result Value Ref Range Status   Result 1 Comment  Final    Comment: (NOTE) KOH/Calcofluor preparation:  no fungus observed. Performed At: [facility] [address] [place], [place] [id number] Jolene Schimke MD [id number]   Gastrointestinal Panel by PCR , Stool     Status: None   Collection Time: 08/05/22  9:27 AM   Specimen: Stool  Result Value Ref Range Status   Campylobacter species NOT DETECTED NOT DETECTED Final   Plesimonas shigelloides NOT DETECTED NOT DETECTED  Final   Salmonella species NOT DETECTED NOT DETECTED Final   Yersinia enterocolitica NOT DETECTED NOT DETECTED Final   Vibrio species NOT DETECTED NOT DETECTED Final   Vibrio cholerae NOT DETECTED NOT DETECTED Final   Enteroaggregative E coli (EAEC) NOT DETECTED NOT DETECTED Final   Enteropathogenic E coli (EPEC) NOT DETECTED NOT DETECTED Final   Enterotoxigenic E coli (ETEC) NOT DETECTED NOT DETECTED Final   Shiga like toxin producing E coli (STEC) NOT DETECTED NOT DETECTED Final   Shigella/Enteroinvasive E coli (EIEC) NOT DETECTED NOT DETECTED Final   Cryptosporidium NOT DETECTED NOT DETECTED Final   Cyclospora cayetanensis NOT DETECTED NOT DETECTED Final   Entamoeba histolytica NOT DETECTED NOT DETECTED Final   Giardia lamblia NOT DETECTED NOT DETECTED Final   Adenovirus F40/41 NOT DETECTED NOT DETECTED Final   Astrovirus NOT DETECTED NOT DETECTED Final   Norovirus GI/GII NOT DETECTED NOT DETECTED Final   Rotavirus A NOT DETECTED NOT DETECTED Final   Sapovirus (I, II, IV, and V) NOT DETECTED NOT DETECTED Final    Comment: Performed at [facility], [address] [place], [place] [address]  Fungus Culture With Stain     Status: None (Preliminary result)   Collection Time: 08/05/22  6:58 PM   Specimen: Wound  Result Value Ref Range Status   Fungus Stain Final report  Final    Comment: (NOTE) Performed At: [facility] [address] [place], [place] [id number] Jolene Schimke MD [id number]    Fungus (Mycology) Culture PENDING  Incomplete   Fungal Source WOUND  Final    Comment: RIGHT RING FINGER PIP JOINT  Performed at [facility], [address], [place], [place] [address]   Aerobic/Anaerobic Culture w Gram Stain (surgical/deep wound)     Status: None (Preliminary result)   Collection Time: 08/05/22  6:58 PM   Specimen: Wound  Result Value Ref Range Status   Specimen Description  Final    WOUND RIGHT RING FINGER PIP JOINT  CULTURE Performed at [facility], [address] [place], [place] [address]    Special Requests   Final    NONE Performed at [facility], [address], [place], [place] [address]    Gram Stain   Final    RARE WBC PRESENT,BOTH PMN AND MONONUCLEAR NO ORGANISMS SEEN    Culture   Final    NO GROWTH 4 DAYS NO ANAEROBES ISOLATED; CULTURE IN PROGRESS FOR 5 DAYS Performed at [facility], [address], [place], [place] [address]    Report Status PENDING  Incomplete  Acid Fast Smear (AFB)     Status: None   Collection Time: 08/05/22  6:58 PM   Specimen: Wound  Result Value Ref Range Status   AFB Specimen Processing Concentration  Final   Acid Fast Smear Negative  Final    Comment: (NOTE) Performed At: [facility] [address] [place], [place] [id number] Jolene Schimke MD [id number]:[id number]    Source (AFB) WOUND  Final    Comment: RIGHT RING FINGER PIP JOINT  Performed at [facility], [address] [place], [place] [address]   Fungus Culture Result     Status: None   Collection Time: 08/05/22  6:58 PM  Result Value Ref Range Status   Result 1 Comment  Final    Comment: (NOTE) KOH/Calcofluor preparation:  no fungus observed. Performed At: [facility] [address] [place], [place] [id number] Jolene Schimke MD [id number]   Fungus Culture With Stain     Status: None (Preliminary result)   Collection Time: 08/05/22  7:15 PM   Specimen: Wound  Result Value Ref Range Status   Fungus Stain Final report  Final    Comment: (NOTE) Performed At: [facility] [address] [place], [place] [id number] Jolene Schimke MD [id number]    Fungus (Mycology) Culture PENDING  Incomplete   Fungal Source WOUND  Final    Comment: RIGHT RING FINGER PIP JOINT  Performed at [facility], [address] [place], [place] [address]   Aerobic/Anaerobic Culture w Gram Stain  (surgical/deep wound)     Status: None (Preliminary result)   Collection Time: 08/05/22  7:15 PM   Specimen: Wound  Result Value Ref Range Status   Specimen Description   Final    WOUND RIGHT RING FINGER PIP JOINT Performed at [facility], [address] [place], [place] [address]    Special Requests   Final    NONE Performed at [facility], [address], [place], [place] [address]    Gram Stain NO WBC SEEN NO ORGANISMS SEEN   Final   Culture   Final    NO GROWTH 4 DAYS NO ANAEROBES ISOLATED; CULTURE IN PROGRESS FOR 5 DAYS Performed at [facility], [address], [place], [place] [address]    Report Status PENDING  Incomplete  Acid Fast Smear (AFB)     Status: None   Collection Time: 08/05/22  7:15 PM   Specimen: Wound  Result Value Ref Range Status   AFB Specimen Processing Concentration  Final   Acid Fast Smear Negative  Final    Comment: (NOTE) Performed At: [facility] [address] [place], [place] [id number] Jolene Schimke MD [id number]    Source (AFB) WOUND  Final    Comment: RIGHT RING FINGER PIP JOINT  Performed at [facility], [address], [place], [place] [address]   Fungus Culture Result     Status: None   Collection Time: 08/05/22  7:15 PM  Result Value Ref Range Status   Result 1 Comment  Final    Comment: (NOTE) KOH/Calcofluor preparation:  no fungus observed. Performed At: [facility] [address] [place], [place] [id number] Jolene Schimke MD [id number]:[id number]      Medications:    enoxaparin (LOVENOX) injection  40 mg Subcutaneous Q24H   escitalopram  10 mg Oral Daily   [START ON 08/15/2022] rabies vaccine  1 mL Intramuscular Once   Continuous Infusions:  ampicillin-sulbactam (UNASYN) IV 3 g (08/11/22 0630)      LOS: 10 days   Marinda Elk  [facility]  08/11/2022, 9:43 AM

## 2022-08-12 ENCOUNTER — Encounter (HOSPITAL_COMMUNITY): Payer: Self-pay | Admitting: Internal Medicine

## 2022-08-12 ENCOUNTER — Inpatient Hospital Stay (HOSPITAL_COMMUNITY): Payer: BC Managed Care – PPO

## 2022-08-12 DIAGNOSIS — Z79899 Other long term (current) drug therapy: Secondary | ICD-10-CM

## 2022-08-12 DIAGNOSIS — S61259D Open bite of unspecified finger without damage to nail, subsequent encounter: Secondary | ICD-10-CM | POA: Diagnosis not present

## 2022-08-12 DIAGNOSIS — W5501XD Bitten by cat, subsequent encounter: Secondary | ICD-10-CM | POA: Diagnosis not present

## 2022-08-12 DIAGNOSIS — M009 Pyogenic arthritis, unspecified: Secondary | ICD-10-CM | POA: Diagnosis present

## 2022-08-12 DIAGNOSIS — L089 Local infection of the skin and subcutaneous tissue, unspecified: Secondary | ICD-10-CM | POA: Diagnosis not present

## 2022-08-12 DIAGNOSIS — L03119 Cellulitis of unspecified part of limb: Secondary | ICD-10-CM | POA: Diagnosis not present

## 2022-08-12 DIAGNOSIS — S61452D Open bite of left hand, subsequent encounter: Secondary | ICD-10-CM | POA: Diagnosis not present

## 2022-08-12 LAB — C-REACTIVE PROTEIN: CRP: <0.5 mg/dL (ref <1.0)

## 2022-08-12 LAB — CBC WITH DIFFERENTIAL/PLATELET
Abs Immature Granulocytes: 0.06 10*3/uL (ref 0.00–0.07)
Basophils Absolute: 0 10*3/uL (ref 0.0–0.1)
Basophils Relative: 1 %
Eosinophils Absolute: 0.1 10*3/uL (ref 0.0–0.5)
Eosinophils Relative: 2 %
HCT: 41.4 % (ref 36.0–46.0)
Hemoglobin: 13.7 g/dL (ref 12.0–15.0)
Immature Granulocytes: 1 %
Lymphocytes Relative: 42 %
Lymphs Abs: 2.5 10*3/uL (ref 0.7–4.0)
MCH: 31.7 pg (ref 26.0–34.0)
MCHC: 33.1 g/dL (ref 30.0–36.0)
MCV: 95.8 fL (ref 80.0–100.0)
Monocytes Absolute: 0.6 10*3/uL (ref 0.1–1.0)
Monocytes Relative: 9 %
Neutro Abs: 2.8 10*3/uL (ref 1.7–7.7)
Neutrophils Relative %: 45 %
Platelets: 205 10*3/uL (ref 150–400)
RBC: 4.32 MIL/uL (ref 3.87–5.11)
RDW: 12.8 % (ref 11.5–15.5)
WBC: 6.1 10*3/uL (ref 4.0–10.5)
nRBC: 0 % (ref 0.0–0.2)

## 2022-08-12 MED ORDER — GADOBUTROL 1 MMOL/ML IV SOLN
8.0000 mL | Freq: Once | INTRAVENOUS | Status: AC | PRN
  Administered 2022-08-12: 8 mL via INTRAVENOUS

## 2022-08-12 NOTE — Plan of Care (Signed)

## 2022-08-12 NOTE — Progress Notes (Signed)
Ortho Progress Note  S: patient doing well. Reports pain has been improving some. Had MRI that was not concerning for osteomyelitis or abscess. Reports she has been waking up at night sweaty but denies fevers. White count has been normal. CRP has been normal. Reports had an episode of clear drainage from ring finger incision site.   O: Somewhat less erythematous on dorsal proximal phalanx and PIP of R ring finger. Sutures  intact on R ring and long fingers. No drainage present at this time. R long finger PIP ROM 10-90 deg. R ring finger active PIP ROM 30-60, passive PIP ROM 20-70 deg.   Left hand no erythema or swelling, full painless ROM, able to make composite fist.  A/P: Right hand cat bite with cellulitis and concern for septic arthritis if long and ring PIP joints.   Continue antibacterial soaks, now BID. Will need to continue w hen discharged. Can discharge home from ortho perspective if primary and ID agree and send out on 3 week course of PO abx. Will need to follow up with me in approx 1 week.   [facility]  [phone]

## 2022-08-12 NOTE — Progress Notes (Addendum)
[facility] Infectious Diseases Follow Up Note  Patient Identification: Patient Name: Yesenia Long MRN: [id number] Admit Date: 08/01/2022 12:15 PM Age: 37 y.o.Today's Date: 08/12/2022  Reason for Visit: cat bite  Principal Problem:   Cat bite of multiple sites of left hand and fingers with infection Active Problems:   Cat bite of right hand including fingers with infection   Current Antibiotics:  Unasyn   Lines/Hardwares:   Interval Events: Remains afebrile, no labs available today    Assessment 10 Y O female admitted with cate bite bilateral hands ( LUE cellulitis and rt 3rd and 4th finger cellulitis)  S/p bedside I and D 4/26 rt hand, Cx negative  S/p I and D rt 3rd and 4th finger with PIP joint, Cx NG, No purulent material in OR note On Unasyn  Pain/swelling and erythema improving but compromised ROM in the rt 3rd and 4th finger MRI 5/6 overall improving findings  I expect the improvement will be gradual and may take several days   Health care maintenance  - s/p Immunoglobulin  - on rabies vaccine series   Recommendations Ok to switch to augmentin when cleared from Orthopedics. Would complete 4 weeks course from OR date to treat for probable septic arthritis of long and ring finger PIP joints.  EOT 5/27.  Complete rabies immunization series as scheduled  Postop care Orthopedics  Fu with Dr Renold Don arranged 5/21 at 1: 45 pm  ID will so, please call with questions.   Rest of the management as per the primary team. Thank you for the consult. Please page with pertinent questions or concerns.  ______________________________________________________________________ Subjective patient seen and examined at the bedside. Pain and redness in the rt third and fourth finger somewhat better but still no full mobility in the fingers. Denies fevers, but has night sweats and chills. Denies nausea, vomiting and diarrhea. Cat is in  quarantine and is OK.   Past Medical History:  Diagnosis Date   Anxiety    Fever blister    Past Surgical History:  Procedure Laterality Date   INCISION AND DRAINAGE OF WOUND Right 08/05/2022   Procedure: IRRIGATION AND DEBRIDEMENT WOUND;  Surgeon: Ramon Dredge, MD;  Location: [facility];  Service: Orthopedics;  Laterality: Right;    Vitals BP (!) 101/57 (BP Location: Right Arm)   Pulse (!) 57   Temp (!) 97.3 F (36.3 C)   Resp 17   Ht 5\' 7"  (1.702 m)   Wt 82.6 kg   SpO2 99%   BMI 28.52 kg/m     Physical Exam Constitutional:  adult female sitting in the bed and not in acute distress     Comments:   Cardiovascular:     Rate and Rhythm: Normal rate and regular rhythm.     Heart sounds: s1s2  Pulmonary:     Effort: Pulmonary effort is normal.     Comments: Normal breath sounds   Abdominal:     Palpations: Abdomen is soft.     Tenderness: non distended and non tender   Musculoskeletal:        General: Rt middle finger - sutures intact with no dehiscence, mild swelling, no drainage, able to flex some at PIP but not completely  Rt ring finger - sutures intact with no dehiscence or drainage. mild swelling. Able to flex some but not completely  No signs of proximal erythema in the rt wrist, rt forearm  Left hand also has some healed appearing scratches from cat bite but no  proximal erythema, able to make a complete fist   Skin:    Comments: as above   Neurological:     General: awake, alert and oriented, follows commands. Grossly non focal   Psychiatric:        Mood and Affect: Mood normal.   Pertinent Microbiology Results for orders placed or performed during the hospital encounter of 08/01/22  Blood culture (routine x 2)     Status: None   Collection Time: 08/01/22  2:26 PM   Specimen: BLOOD LEFT FOREARM  Result Value Ref Range Status   Specimen Description   Final    BLOOD LEFT FOREARM Performed at [facility], [address]  [place], [place] [address]    Special Requests   Final    BOTTLES DRAWN AEROBIC AND ANAEROBIC Blood Culture adequate volume Performed at [facility], [address] [place], [place] [address]    Culture   Final    NO GROWTH 5 DAYS Performed at [facility], [address] [place], [place] [address]    Report Status 08/06/2022 FINAL  Final  Blood culture (routine x 2)     Status: None   Collection Time: 08/01/22  2:26 PM   Specimen: BLOOD  Result Value Ref Range Status   Specimen Description   Final    BLOOD RIGHT ANTECUBITAL Performed at [facility], [address] [place], [place] [address]    Special Requests   Final    BOTTLES DRAWN AEROBIC AND ANAEROBIC Blood Culture results may not be optimal due to an excessive volume of blood received in culture bottles Performed at [facility], [address] [place], [place] [address]    Culture   Final    NO GROWTH 5 DAYS Performed at [facility], [address], [place], [place] [address]    Report Status 08/06/2022 FINAL  Final  Fungus Culture With Stain     Status: None (Preliminary result)   Collection Time: 08/02/22 12:43 AM   Specimen: Hand, Right  Result Value Ref Range Status   Fungus Stain Final report  Final    Comment: (NOTE) Performed At: [facility] [address] [place], [place] [id number] Jolene Schimke MD [id number]:[id number]    Fungus (Mycology) Culture PENDING  Incomplete   Fungal Source ABCESS, RIGHT HAND  Final    Comment: Performed at [facility], [address] [place], [place] [address]  Acid Fast Smear (AFB)     Status: None   Collection Time: 08/02/22 12:43 AM   Specimen: Hand, Right; Abscess  Result Value Ref Range Status   AFB Specimen Processing Concentration  Final   Acid Fast Smear Negative  Final    Comment: (NOTE) Performed At: [facility] [address] [place], [place]  [id number] Jolene Schimke MD ZS:[id number]    Source (AFB) ABCESS, RIGHT HAND  Final    Comment: Performed at [facility], [address], [place], [place] [address]  Aerobic Culture w Gram Stain (superficial specimen)     Status: None   Collection Time: 08/02/22 12:43 AM   Specimen: Abscess  Result Value Ref Range Status   Specimen Description   Final    ABSCESS RT HAND Performed at [facility], [address], [place], [place] [address]    Special Requests   Final    NONE Performed at [facility], [address] [place], [place] [address]  Gram Stain NO WBC SEEN NO ORGANISMS SEEN   Final   Culture   Final    NO GROWTH 2 DAYS Performed at [facility], [address] [place], [place] [address]    Report Status 08/05/2022 FINAL  Final  Fungus Culture Result     Status: None   Collection Time: 08/02/22 12:43 AM  Result Value Ref Range Status   Result 1 Comment  Final    Comment: (NOTE) KOH/Calcofluor preparation:  no fungus observed. Performed At: [facility] [address] [place], [place] [id number] Jolene Schimke MD [id number]   Gastrointestinal Panel by PCR , Stool     Status: None   Collection Time: 08/05/22  9:27 AM   Specimen: Stool  Result Value Ref Range Status   Campylobacter species NOT DETECTED NOT DETECTED Final   Plesimonas shigelloides NOT DETECTED NOT DETECTED Final   Salmonella species NOT DETECTED NOT DETECTED Final   Yersinia enterocolitica NOT DETECTED NOT DETECTED Final   Vibrio species NOT DETECTED NOT DETECTED Final   Vibrio cholerae NOT DETECTED NOT DETECTED Final   Enteroaggregative E coli (EAEC) NOT DETECTED NOT DETECTED Final   Enteropathogenic E coli (EPEC) NOT DETECTED NOT DETECTED Final   Enterotoxigenic E coli (ETEC) NOT DETECTED NOT DETECTED Final   Shiga like toxin producing E coli (STEC) NOT DETECTED NOT DETECTED Final   Shigella/Enteroinvasive E coli  (EIEC) NOT DETECTED NOT DETECTED Final   Cryptosporidium NOT DETECTED NOT DETECTED Final   Cyclospora cayetanensis NOT DETECTED NOT DETECTED Final   Entamoeba histolytica NOT DETECTED NOT DETECTED Final   Giardia lamblia NOT DETECTED NOT DETECTED Final   Adenovirus F40/41 NOT DETECTED NOT DETECTED Final   Astrovirus NOT DETECTED NOT DETECTED Final   Norovirus GI/GII NOT DETECTED NOT DETECTED Final   Rotavirus A NOT DETECTED NOT DETECTED Final   Sapovirus (I, II, IV, and V) NOT DETECTED NOT DETECTED Final    Comment: Performed at [facility], [address] [place], [place] [address]  Fungus Culture With Stain     Status: None (Preliminary result)   Collection Time: 08/05/22  6:58 PM   Specimen: Wound  Result Value Ref Range Status   Fungus Stain Final report  Final    Comment: (NOTE) Performed At: [facility] [address] [place], [place] [id number] Jolene Schimke MD [id number]    Fungus (Mycology) Culture PENDING  Incomplete   Fungal Source WOUND  Final    Comment: RIGHT RING FINGER PIP JOINT  Performed at [facility], [address] [place], [place] [address]   Aerobic/Anaerobic Culture w Gram Stain (surgical/deep wound)     Status: None   Collection Time: 08/05/22  6:58 PM   Specimen: Wound  Result Value Ref Range Status   Specimen Description   Final    WOUND RIGHT RING FINGER PIP JOINT CULTURE Performed at [facility], [address], [place], [place] [address]    Special Requests   Final    NONE Performed at [facility], [address], [place], [place] [address]    Gram Stain   Final    RARE WBC PRESENT,BOTH PMN AND MONONUCLEAR NO ORGANISMS SEEN    Culture   Final    No growth aerobically or anaerobically. Performed at [facility], [address], [place], [place] [address]    Report Status 08/11/2022 FINAL  Final  Acid Fast Smear (AFB)     Status: None  Collection  Time: 08/05/22  6:58 PM   Specimen: Wound  Result Value Ref Range Status   AFB Specimen Processing Concentration  Final   Acid Fast Smear Negative  Final    Comment: (NOTE) Performed At: [facility] [address] [place], [place] [id number] Jolene Schimke MD WG:[id number]    Source (AFB) WOUND  Final    Comment: RIGHT RING FINGER PIP JOINT  Performed at [facility], [address], [place], [place] [address]   Fungus Culture Result     Status: None   Collection Time: 08/05/22  6:58 PM  Result Value Ref Range Status   Result 1 Comment  Final    Comment: (NOTE) KOH/Calcofluor preparation:  no fungus observed. Performed At: [facility] [address] [place], [place] [id number] Jolene Schimke MD [id number]:[id number]   Fungus Culture With Stain     Status: None (Preliminary result)   Collection Time: 08/05/22  7:15 PM   Specimen: Wound  Result Value Ref Range Status   Fungus Stain Final report  Final    Comment: (NOTE) Performed At: [facility] [address] [place], [place] [id number] Jolene Schimke MD VZ:[id number]    Fungus (Mycology) Culture PENDING  Incomplete   Fungal Source WOUND  Final    Comment: RIGHT RING FINGER PIP JOINT  Performed at [facility], [address], [place], [place] [address]   Aerobic/Anaerobic Culture w Gram Stain (surgical/deep wound)     Status: None   Collection Time: 08/05/22  7:15 PM   Specimen: Wound  Result Value Ref Range Status   Specimen Description   Final    WOUND RIGHT RING FINGER PIP JOINT Performed at [facility], [address] [place], [place] [address]    Special Requests   Final    NONE Performed at [facility], [address] [place], [place] [address]    Gram Stain NO WBC SEEN NO ORGANISMS SEEN   Final   Culture   Final    No growth aerobically or anaerobically. Performed at [facility], [address] [place],  [place] [address]    Report Status 08/11/2022 FINAL  Final  Acid Fast Smear (AFB)     Status: None   Collection Time: 08/05/22  7:15 PM   Specimen: Wound  Result Value Ref Range Status   AFB Specimen Processing Concentration  Final   Acid Fast Smear Negative  Final    Comment: (NOTE) Performed At: [facility] [address] [place], [place] [id number] Jolene Schimke MD [id number]    Source (AFB) WOUND  Final    Comment: RIGHT RING FINGER PIP JOINT  Performed at [facility], [address] [place], [place] [address]   Fungus Culture Result     Status: None   Collection Time: 08/05/22  7:15 PM  Result Value Ref Range Status   Result 1 Comment  Final    Comment: (NOTE) KOH/Calcofluor preparation:  no fungus observed. Performed At: [facility] [address] [place], [place] [id number] Jolene Schimke MD [id number]     Pertinent Lab.    Latest Ref Rng & Units 08/07/2022    5:37 AM 08/06/2022    5:55 AM 08/05/2022    5:56 AM  CBC  WBC 4.0 - 10.5 K/uL 7.0  8.1  5.2   Hemoglobin 12.0 - 15.0 g/dL 70.3  50.0  93.8   Hematocrit 36.0 - 46.0 % 38.3  42.7  40.4   Platelets 150 - 400 K/uL 213  264  188       Latest Ref Rng & Units 08/07/2022    5:37 AM 08/06/2022    5:55 AM 08/03/2022    6:52 AM  CMP  Glucose 70 - 99 mg/dL 83  161  81   BUN 6 - 20 mg/dL 15  15  12    Creatinine 0.44 - 1.00 mg/dL 0.96  0.45  4.09   Sodium 135 - 145 mmol/L 137  134  136   Potassium 3.5 - 5.1 mmol/L 4.0  4.3  4.2   Chloride 98 - 111 mmol/L 106  102  107   CO2 22 - 32 mmol/L 25  23  21    Calcium 8.9 - 10.3 mg/dL 8.3  8.4  8.1      Pertinent Imaging today Plain films and CT images have been personally visualized and interpreted; radiology reports have been reviewed. Decision making incorporated into the Impression /   MR HAND RIGHT W WO CONTRAST  Result Date: 08/02/2022 CLINICAL DATA:  Soft tissue infection suspected. Cat bites at multiple sites of right hand and  fingers 07/31/2022. Multiple puncture wounds to posterior surface of hand and fingers. Swelling. EXAM: MRI OF THE RIGHT HAND WITHOUT AND WITH CONTRAST TECHNIQUE: Multiplanar, multisequence MR imaging of the right hand was performed before and after the administration of intravenous contrast. CONTRAST:  8mL GADAVIST GADOBUTROL 1 MMOL/ML IV SOLN COMPARISON:  Right hand radiographs 08/01/2022 FINDINGS: Bones/Joint/Cartilage Normal marrow signal without edema or enhancement. The cortices are intact. No MRI evidence of acute osteomyelitis. Mild thumb carpometacarpal cartilage thinning and peripheral degenerative spurring. There are mild-to-moderate fourth PIP, mild-to-moderate third PIP, and mild second PIP joint effusions. Ligaments The metacarpophalangeal and interphalangeal collateral ligaments appear intact. Muscles and Tendons There is trace fluid within the third dorsal extensor tendon sheath at the level of the metacarpophalangeal joints and base of the proximal phalanx (axial series 4, image 25) with minimal peripheral synovial enhancement. There is mild fluid within the third and fourth flexor tendon compartments at the level of the distal shaft and metacarpal head (axial series 4 images 19 through 22) with trace peripheral synovial enhancement. There appears to be a tiny defect within the region of the fourth finger extensor mechanism/PIP joint extensor expansion hood at the level of the PIP joint/head of the proximal phalanx (axial series 4, image 32, axial series 11, image 34), and it is difficult to exclude a tiny partial-thickness tear in this region, possibly from the extensor expansion hood, from the reported cat bites. This is tiny and possibly clinically insignificant. Soft tissues There is moderate edema and soft tissue swelling of the subcutaneous fat dorsal to the third through fifth metacarpals, extending from the proximal metacarpal shafts through the distal metacarpals and continuing into the  third and fourth greater than second fingers. At the level of the metacarpals, this region measures up to approximately 5.5 x 0.9 x 6.1 cm in greatest transverse by AP by craniocaudal dimensions (as measured on axial series 4, image 18 and sagittal series 7, image 22). There is mild peripheral enhancement, however no walled-off abscess is seen. This appears to represent cellulitis and developing phlegmon. There is moderate diffuse third and fourth and mild 2nd finger soft tissue enhancement. IMPRESSION: 1. There is moderate edema and soft tissue swelling of the subcutaneous fat dorsal to the third through fifth metacarpals, extending from the proximal metacarpal shafts through the distal metacarpals and continuing into  the third and fourth greater than second fingers. There is mild peripheral enhancement of the greatest subcutaneous fat edema at the level of the distal metacarpals,, however no walled-off abscess is seen. This appears to represent cellulitis and possible developing phlegmon. 2. There is trace fluid within the third extensor tendon sheath at the level of the metacarpophalangeal joints and base of the proximal phalanx with minimal peripheral synovial enhancement. There is also mild fluid within the third and fourth flexor tendon sheath at the level of the distal shaft and metacarpal head with trace peripheral synovial enhancement. MRI cannot exclude the possible presence of infection within this tendon sheath fluid. 3. Possible punctate defect within the region of the fourth finger extensor mechanism/PIP joint extensor expansion hood at the level of the PIP joint/head of the proximal phalanx. Due to the small size, this may be clinically insignificant. Recommend clinical correlation. 4. No osteomyelitis. Electronically Signed   By: Neita Garnet M.D.   On: 08/02/2022 09:07   MR HAND LEFT W WO CONTRAST  Result Date: 08/02/2022 CLINICAL DATA:  Soft tissue infection suspected. Cat bites on multiple  sides of the bilateral hands and fingers with infection 07/31/2022. Multiple puncture wounds to posterior surface of fingers. EXAM: MRI OF THE LEFT HAND WITHOUT AND WITH CONTRAST TECHNIQUE: Multiplanar, multisequence MR imaging of the left hand was performed before and after the administration of intravenous contrast. CONTRAST:  8mL GADAVIST GADOBUTROL 1 MMOL/ML IV SOLN COMPARISON:  Left hand radiographs 08/01/2022 FINDINGS: Bones/Joint/Cartilage Normal marrow signal. The cortices are intact. No acute fracture. No MRI findings of acute osteomyelitis. Joint spaces are maintained. Mild thumb carpometacarpal cartilage thinning and peripheral degenerative spurring.a There is a tiny index finger PIP joint effusion (axial series 4, image 31, sagittal series 7, image 31). No associated synovial enhancement. Ligaments The collateral ligaments of the metacarpophalangeal and interphalangeal joints appear intact. Muscles and Tendons There is trace fluid within the third and fourth extensor tendon sheaths at the level of the proximal and mid metacarpals. A focal focus of extensor tendon sheath enhancement is seen within the third finger at the level of third metacarpal neck (axial series 9, image 20). Additional mild focal synovial enhancement within the extensor tendon sheath at the level of the third finger PIP joint (axial series 9, image 33). Soft tissues There is moderate subcutaneous fat edema and swelling dorsal to the second through fourth carpometacarpal joints, dorsal to the second through fourth proximal distal metacarpals, and dorsal to the third finger proximal phalanx and more diffusely (palmar and dorsal) throughout the mid to distal third finger. There is mildly confluent fluid around the border of this region (e.g. axial series 4, image 16), greatest at the level of the mid metacarpal shafts, however this does not appear to enhance, no well-defined wall of an abscess is seen at this time. The soft tissue  swelling and apparent developing phlegmon involves a region measuring up to 4.9 cm in transverse dimension (axial image 16), 0.8 cm in AP thickness (axial image 16) and 8.7 cm along the dorsal aspect of the hand at the level of the metacarpals (as measured on sagittal series 7, image 21). IMPRESSION: 1. There is moderate subcutaneous fat edema and swelling dorsal to the second through fourth metacarpals and extending into the third finger. There is mildly confluence fluid around the border of this region, greatest at the level of the mid metacarpal shafts, however this does not appear to enhance, and no well-defined wall of an abscess is  seen at this time. This presumably reflects cellulitis and inflammatory phlegmon. 2. There is only trace fluid within the third and fourth extensor tendon sheaths at the level of the proximal and mid metacarpals. No tendon sheath enhancement in this region. MRI cannot exclude the presence of infection within this fluid. 3. There is a focal focus of extensor tendon sheath enhancement within the third finger at the level of the third metacarpal neck and again focally within the extensor tendon sheath at the level of the PIP joint. 4. There is a tiny index finger PIP joint effusion. No associated synovial enhancement. 5. No MRI findings of acute osteomyelitis. Electronically Signed   By: Neita Garnet M.D.   On: 08/02/2022 08:37   DG Hand Complete Right  Result Date: 08/01/2022 CLINICAL DATA:  Multiple cat bites to hands and fingers 07/31/2022. Multiple puncture wounds. Swelling and infection. EXAM: RIGHT HAND - COMPLETE 3+ VIEW COMPARISON:  None Available. FINDINGS: Normal bone mineralization. Joint spaces are preserved. The cortices are intact. Mild soft tissue swelling and edema greatest at the dorsal aspect of the metacarpal heads. Finger soft tissue swelling greatest at the proximal third and fourth greater than second fingers. No subcutaneous air. No radiopaque foreign body.  IMPRESSION: Soft tissue swelling as above. No subcutaneous air or radiopaque foreign body. No cortical erosion. Electronically Signed   By: Neita Garnet M.D.   On: 08/01/2022 20:35   DG Hand Complete Left  Result Date: 08/01/2022 CLINICAL DATA:  Soft tissue infection suspected. Cat bite of multiple sites of right hand and fingers with infection 07/31/2022. Multiple puncture wounds to posterior surface hands and fingers. EXAM: LEFT HAND - COMPLETE 3+ VIEW COMPARISON:  None Available. FINDINGS: Normal bone mineralization. Joint spaces are preserved. The cortices are intact. No subcutaneous air. No radiopaque foreign bodies are seen. Mild soft tissue swelling at the dorsal aspect of the metacarpals and of the proximal fingers, especially the third greater than second finger. IMPRESSION: Soft tissue swelling as above. No radiopaque foreign body or cortical erosion. Electronically Signed   By: Neita Garnet M.D.   On: 08/01/2022 20:33    I have personally spent 56 minutes involved in face-to-face and non-face-to-face activities for this patient on the day of the visit. Professional time spent includes the following activities: Preparing to see the patient (review of tests), Obtaining and/or reviewing separately obtained history (admission/discharge record), Performing a medically appropriate examination and/or evaluation , Ordering medications/tests/procedures, referring and communicating with other health care professionals, Documenting clinical information in the EMR, Independently interpreting results (not separately reported), Communicating results to the patient/family/caregiver, Counseling and educating the patient/family/caregiver and Care coordination (not separately reported).   Plan d/w requesting provider as well as ID pharm D  Note: This document was prepared using dragon voice recognition software and may include unintentional dictation errors.   Electronically signed by:   Odette Fraction,  MD Infectious Disease Physician [facility] Pager: [phone]

## 2022-08-12 NOTE — Progress Notes (Signed)
[facility] PROGRESS NOTE    Progress Note  Yesenia Long  ZOX:[id number] DOB: 1985-04-30 DOA: 08/01/2022 PCP: Collene Mares, PA     Brief Narrative:   Yesenia Long is an 37 y.o. female past medical history significant for anxiety presents to the ED for evaluation of a cat bite, Satus post I&D in the OR on 08/05/2022, Blood cultures have been negative to 2.  And surgery on board ID was consulted recommended to repeat an MRI on 08/12/2022.  De-escalate IV antibiotics to IV Unasyn   Assessment/Plan:   Cellulitis of multiple sites both hands left shoulder secondary to  Cat bite of multiple sites of left hand and fingers with infection status post I&D by hand surgeon: 4 doses of the rabies vaccine over 14 days (day 0, 3, 7 and 14) received immunoglobulin and 1st rabies vaccine on 4/25, 2nd dose on 4/28, next doses will be on 5/2 and 5/9 to complete the series  Consulted ID who recommended to monitor on IV antibiotics (Unasyn). MRI on 08/12/2022 to rule out osteomyelitis. Continue IV Unasyn  If MRI negative, will need amoxicillin for 3 weeks.  History of anxiety: Continue Lexapro.   DVT prophylaxis: lovenox Family Communication:none Status is: Inpatient Remains inpatient appropriate because: Cellulitis of multiple sites    Code Status:     Code Status Orders  (From admission, onward)           Start     Ordered   08/01/22 1908  Full code  Continuous       Question:  By:  Answer:  Consent: discussion documented in EHR   08/01/22 1908           Code Status History     This patient has a current code status but no historical code status.         IV Access:   Peripheral IV   Procedures and diagnostic studies:   No results found.   Medical Consultants:   None.   Subjective:    Yesenia Long no new complaints  Objective:    Vitals:   08/11/22 0545 08/11/22 1230 08/11/22 2018 08/12/22 0404  BP: (!) 93/54 123/87 115/87 (!) 101/57   Pulse: (!) 58 79 100 (!) 57  Resp: 17 18 18 17   Temp: 97.7 F (36.5 C) 98 F (36.7 C) 98.3 F (36.8 C) (!) 97.3 F (36.3 C)  TempSrc: Oral Oral    SpO2: 98% 97% 97% 99%  Weight:      Height:       SpO2: 99 %   Intake/Output Summary (Last 24 hours) at 08/12/2022 0946 Last data filed at 08/11/2022 2357 Gross per 24 hour  Intake 340 ml  Output --  Net 340 ml    Filed Weights   08/01/22 1134 08/01/22 1323 08/05/22 1823  Weight: 82.6 kg 82.6 kg 82.6 kg    Exam: General exam: In no acute distress. Respiratory system: Good air movement and clear to auscultation. Cardiovascular system: S1 & S2 heard, RRR. No JVD. Gastrointestinal system: Abdomen is nondistended, soft and nontender.  Extremities: No pedal edema. Skin: No rashes, lesions or ulcers Psychiatry: Judgement and insight appear normal. Mood & affect appropriate. Data Reviewed:    Labs: Basic Metabolic Panel: Recent Labs  Lab 08/06/22 0555 08/07/22 0537  NA 134* 137  K 4.3 4.0  CL 102 106  CO2 23 25  GLUCOSE 151* 83  BUN 15 15  CREATININE 0.81 0.68  CALCIUM 8.4* 8.3*  GFR Estimated Creatinine Clearance: 104.3 mL/min (by C-G formula based on SCr of 0.68 mg/dL). Liver Function Tests: No results for input(s): "AST", "ALT", "ALKPHOS", "BILITOT", "PROT", "ALBUMIN" in the last 168 hours.  No results for input(s): "LIPASE", "AMYLASE" in the last 168 hours. No results for input(s): "AMMONIA" in the last 168 hours. Coagulation profile No results for input(s): "INR", "PROTIME" in the last 168 hours. COVID-19 Labs  Recent Labs    08/10/22 1229 08/11/22 0652  CRP 1.4* <0.5     No results found for: "SARSCOV2NAA"  CBC: Recent Labs  Lab 08/06/22 0555 08/07/22 0537  WBC 8.1 7.0  HGB 14.0 12.4  HCT 42.7 38.3  MCV 93.6 95.8  PLT 264 213    Cardiac Enzymes: No results for input(s): "CKTOTAL", "CKMB", "CKMBINDEX", "TROPONINI" in the last 168 hours. BNP (last 3 results) No results for input(s):  "PROBNP" in the last 8760 hours. CBG: No results for input(s): "GLUCAP" in the last 168 hours. D-Dimer: No results for input(s): "DDIMER" in the last 72 hours. Hgb A1c: No results for input(s): "HGBA1C" in the last 72 hours. Lipid Profile: No results for input(s): "CHOL", "HDL", "LDLCALC", "TRIG", "CHOLHDL", "LDLDIRECT" in the last 72 hours. Thyroid function studies: No results for input(s): "TSH", "T4TOTAL", "T3FREE", "THYROIDAB" in the last 72 hours.  Invalid input(s): "FREET3" Anemia work up: No results for input(s): "VITAMINB12", "FOLATE", "FERRITIN", "TIBC", "IRON", "RETICCTPCT" in the last 72 hours. Sepsis Labs: Recent Labs  Lab 08/06/22 0555 08/07/22 0537  WBC 8.1 7.0    Microbiology Recent Results (from the past 240 hour(s))  Gastrointestinal Panel by PCR , Stool     Status: None   Collection Time: 08/05/22  9:27 AM   Specimen: Stool  Result Value Ref Range Status   Campylobacter species NOT DETECTED NOT DETECTED Final   Plesimonas shigelloides NOT DETECTED NOT DETECTED Final   Salmonella species NOT DETECTED NOT DETECTED Final   Yersinia enterocolitica NOT DETECTED NOT DETECTED Final   Vibrio species NOT DETECTED NOT DETECTED Final   Vibrio cholerae NOT DETECTED NOT DETECTED Final   Enteroaggregative E coli (EAEC) NOT DETECTED NOT DETECTED Final   Enteropathogenic E coli (EPEC) NOT DETECTED NOT DETECTED Final   Enterotoxigenic E coli (ETEC) NOT DETECTED NOT DETECTED Final   Shiga like toxin producing E coli (STEC) NOT DETECTED NOT DETECTED Final   Shigella/Enteroinvasive E coli (EIEC) NOT DETECTED NOT DETECTED Final   Cryptosporidium NOT DETECTED NOT DETECTED Final   Cyclospora cayetanensis NOT DETECTED NOT DETECTED Final   Entamoeba histolytica NOT DETECTED NOT DETECTED Final   Giardia lamblia NOT DETECTED NOT DETECTED Final   Adenovirus F40/41 NOT DETECTED NOT DETECTED Final   Astrovirus NOT DETECTED NOT DETECTED Final   Norovirus GI/GII NOT DETECTED NOT  DETECTED Final   Rotavirus A NOT DETECTED NOT DETECTED Final   Sapovirus (I, II, IV, and V) NOT DETECTED NOT DETECTED Final    Comment: Performed at [facility], [address] [place], [place] [address]  Fungus Culture With Stain     Status: None (Preliminary result)   Collection Time: 08/05/22  6:58 PM   Specimen: Wound  Result Value Ref Range Status   Fungus Stain Final report  Final    Comment: (NOTE) Performed At: [facility] [address] [place], [place] [id number] Jolene Schimke MD XB:[id number]    Fungus (Mycology) Culture PENDING  Incomplete   Fungal Source WOUND  Final    Comment: RIGHT RING FINGER PIP JOINT  Performed at  [facility], [address] [place], [place] [address]   Aerobic/Anaerobic Culture w Gram Stain (surgical/deep wound)     Status: None   Collection Time: 08/05/22  6:58 PM   Specimen: Wound  Result Value Ref Range Status   Specimen Description   Final    WOUND RIGHT RING FINGER PIP JOINT CULTURE Performed at [facility], [address] [place], [place] [address]    Special Requests   Final    NONE Performed at [facility], [address], [place], [place] [address]    Gram Stain   Final    RARE WBC PRESENT,BOTH PMN AND MONONUCLEAR NO ORGANISMS SEEN    Culture   Final    No growth aerobically or anaerobically. Performed at [facility], [address] [place], [place] [address]    Report Status 08/11/2022 FINAL  Final  Acid Fast Smear (AFB)     Status: None   Collection Time: 08/05/22  6:58 PM   Specimen: Wound  Result Value Ref Range Status   AFB Specimen Processing Concentration  Final   Acid Fast Smear Negative  Final    Comment: (NOTE) Performed At: [facility] [address] [place], [place] [id number] Jolene Schimke MD NU:[id number]    Source (AFB) WOUND  Final    Comment: RIGHT RING FINGER PIP JOINT  Performed at [facility], [address], [place], [place] [address]   Fungus Culture Result     Status: None   Collection Time: 08/05/22  6:58 PM  Result Value Ref Range Status   Result 1 Comment  Final    Comment: (NOTE) KOH/Calcofluor preparation:  no fungus observed. Performed At: [facility] [address] [place], [place] [id number] Jolene Schimke MD [id number]   Fungus Culture With Stain     Status: None (Preliminary result)   Collection Time: 08/05/22  7:15 PM   Specimen: Wound  Result Value Ref Range Status   Fungus Stain Final report  Final    Comment: (NOTE) Performed At: [facility] [address] [place], [place] [id number] Jolene Schimke MD UX:[id number]    Fungus (Mycology) Culture PENDING  Incomplete   Fungal Source WOUND  Final    Comment: RIGHT RING FINGER PIP JOINT  Performed at [facility], [address], [place], [place] [address]   Aerobic/Anaerobic Culture w Gram Stain (surgical/deep wound)     Status: None   Collection Time: 08/05/22  7:15 PM   Specimen: Wound  Result Value Ref Range Status   Specimen Description   Final    WOUND RIGHT RING FINGER PIP JOINT Performed at [facility], [address], [place], [place] [address]    Special Requests   Final    NONE Performed at [facility], [address] [place], [place] [address]    Gram Stain NO WBC SEEN NO ORGANISMS SEEN   Final   Culture   Final    No growth aerobically or anaerobically. Performed at [facility], [address] [place], [place] [address]    Report Status 08/11/2022 FINAL  Final  Acid Fast Smear (AFB)     Status: None   Collection Time: 08/05/22  7:15 PM   Specimen: Wound  Result Value Ref Range Status   AFB Specimen Processing Concentration  Final   Acid Fast Smear Negative  Final    Comment: (NOTE) Performed At: [facility]  [facility] [address] [place], [place] [id number] Jolene Schimke MD  [id number]    Source (AFB) WOUND  Final    Comment: RIGHT RING FINGER PIP JOINT  Performed at [facility], [address], [place], [place] [address]   Fungus Culture Result     Status: None   Collection Time: 08/05/22  7:15 PM  Result Value Ref Range Status   Result 1 Comment  Final    Comment: (NOTE) KOH/Calcofluor preparation:  no fungus observed. Performed At: [facility] [address] [place], [place] [id number] Jolene Schimke MD [id number]:[id number]      Medications:    enoxaparin (LOVENOX) injection  40 mg Subcutaneous Q24H   escitalopram  10 mg Oral Daily   [START ON 08/15/2022] rabies vaccine  1 mL Intramuscular Once   Continuous Infusions:  ampicillin-sulbactam (UNASYN) IV 3 g (08/12/22 0616)      LOS: 11 days   Yesenia Long  [facility]  08/12/2022, 9:46 AM

## 2022-08-13 ENCOUNTER — Other Ambulatory Visit (HOSPITAL_COMMUNITY): Payer: Self-pay

## 2022-08-13 DIAGNOSIS — S61259D Open bite of unspecified finger without damage to nail, subsequent encounter: Secondary | ICD-10-CM | POA: Diagnosis not present

## 2022-08-13 DIAGNOSIS — S61452D Open bite of left hand, subsequent encounter: Secondary | ICD-10-CM | POA: Diagnosis not present

## 2022-08-13 DIAGNOSIS — L089 Local infection of the skin and subcutaneous tissue, unspecified: Secondary | ICD-10-CM | POA: Diagnosis not present

## 2022-08-13 DIAGNOSIS — W5501XD Bitten by cat, subsequent encounter: Secondary | ICD-10-CM | POA: Diagnosis not present

## 2022-08-13 LAB — C-REACTIVE PROTEIN: CRP: 0.6 mg/dL (ref <1.0)

## 2022-08-13 MED ORDER — OXYCODONE HCL 5 MG PO TABS
5.0000 mg | ORAL_TABLET | ORAL | 0 refills | Status: AC | PRN
  Filled 2022-08-13: qty 24, 3d supply, fill #0

## 2022-08-13 MED ORDER — AMOXICILLIN-POT CLAVULANATE 875-125 MG PO TABS
1.0000 | ORAL_TABLET | Freq: Two times a day (BID) | ORAL | 0 refills | Status: AC
  Filled 2022-08-13: qty 44, 22d supply, fill #0

## 2022-08-13 NOTE — Discharge Summary (Addendum)
Physician Discharge Summary  Yesenia Long ZOX:[id number] DOB: January 25, 1986 DOA: 08/01/2022  PCP: Collene Mares, PA  Admit date: 08/01/2022 Discharge date: 08/13/2022  Admitted From: Home Disposition:  Home  Recommendations for Outpatient Follow-up:  Follow up with hand surgery in 1 week. Follow-up with ID in 3 weeks Please obtain BMP/CBC in one week. Follow-up with health department as an outpatient to get last dose of the rabies vaccine on 08/15/2022   Home Health:No Equipment/Devices:None  Discharge Condition:Stable CODE STATUS:Full Diet recommendation: Heart Healthy  Brief/Interim Summary: 37 y.o. female past medical history significant for anxiety presents to the ED for evaluation of a cat bite, Satus post I&D in the OR on 08/05/2022, Blood cultures have been negative to 2.  And surgery on board ID was consulted   Discharge Diagnoses:  Principal Problem:   Cat bite of multiple sites of left hand and fingers with infection Active Problems:   Cat bite of right hand including fingers with infection   Septic arthritis of interphalangeal joint of finger of right hand (HCC)   Medication management  Cellulitis of multiple sites of both hands secondary to cat bite of multiple sites of the left hand and finger with infection: Hand surgery was consulted she status post I&D in the OR on 08/05/2022, on admission she was started on IV Rocephin and Flagyl along with azithromycin to cover for scratch at disease. She received 3 doses of the rabies vaccine her next dose is on 08/15/2022. Infectious ease was consulted who recommended to change antibiotics to IV Unasyn and get an MRI which showed no osteomyelitis and decrease swelling. She will continue oral Augmentin for 3 weeks. Follow-up with hand surgery in 1 week and infectious disease in 3 weeks. Culture data remain negative till date.  History of anxiety: Continue Lexapro  Discharge Instructions  Discharge Instructions     Diet -  low sodium heart healthy   Complete by: As directed    Discharge wound care:   Complete by: As directed    No dressing changes   Increase activity slowly   Complete by: As directed       Allergies as of 08/13/2022       Reactions   Penicillins Rash   Patient states "all cillins"        Medication List     TAKE these medications    amoxicillin-clavulanate 875-125 MG tablet Commonly known as: AUGMENTIN Take 1 tablet by mouth 2 (two) times daily for 22 days.   escitalopram 10 MG tablet Commonly known as: LEXAPRO Take 10 mg by mouth daily.   naproxen 500 MG tablet Commonly known as: NAPROSYN Take 500 mg by mouth 2 (two) times daily.   oxyCODONE 5 MG immediate release tablet Commonly known as: Oxy IR/ROXICODONE Take 1 tablet (5 mg total) by mouth every 3 (three) hours as needed for up to 3 days for severe pain.               Discharge Care Instructions  (From admission, onward)           Start     Ordered   08/13/22 0000  Discharge wound care:       Comments: No dressing changes   08/13/22 1019            Allergies  Allergen Reactions   Penicillins Rash    Patient states "all cillins"    Consultations: General surgery Infectious disease   Procedures/Studies: MR HAND RIGHT W WO CONTRAST  Result Date: 08/12/2022 CLINICAL DATA:  Osteomyelitis, hand EXAM: MRI OF THE RIGHT HAND WITHOUT AND WITH CONTRAST TECHNIQUE: Multiplanar, multisequence MR imaging of the right hand was performed before and after the administration of intravenous contrast. CONTRAST:  8mL GADAVIST GADOBUTROL 1 MMOL/ML IV SOLN COMPARISON:  MRI 08/01/2022 FINDINGS: Bones/Joint/Cartilage There is no significant marrow signal alteration. The cortex is intact. No MR evidence of osteomyelitis. Mild first CMC chondrosis. Trace middle and ring finger joint effusions. Ligaments Intact collateral ligaments.  No evidence of sagittal band tear. Muscles and Tendons Decreased, essentially  resolved tenosynovial fluid in the flexor and extensor tendons. No significant tenosynovitis. No acute tendon tear. Soft tissues There is improvement in soft tissue swelling with mild residual edema of the middle and ring fingers. There is mild, ill-defined soft tissue enhancement along the middle and ring fingers at the level of the PIP joints and radial aspect of the middle finger along the proximal phalanx. No evidence of soft tissue abscess. IMPRESSION: Improved soft tissue swelling of the hand and fingers in comparison to prior MRI, with mild residual edema and areas of soft tissue enhancement of the middle and ring fingers, likely representing postsurgical change and/or residual cellulitis. No evidence of soft tissue abscess. No evidence of tenosynovitis or osteomyelitis. Electronically Signed   By: Caprice Renshaw M.D.   On: 08/12/2022 16:29   MR HAND RIGHT W WO CONTRAST  Result Date: 08/02/2022 CLINICAL DATA:  Soft tissue infection suspected. Cat bites at multiple sites of right hand and fingers 07/31/2022. Multiple puncture wounds to posterior surface of hand and fingers. Swelling. EXAM: MRI OF THE RIGHT HAND WITHOUT AND WITH CONTRAST TECHNIQUE: Multiplanar, multisequence MR imaging of the right hand was performed before and after the administration of intravenous contrast. CONTRAST:  8mL GADAVIST GADOBUTROL 1 MMOL/ML IV SOLN COMPARISON:  Right hand radiographs 08/01/2022 FINDINGS: Bones/Joint/Cartilage Normal marrow signal without edema or enhancement. The cortices are intact. No MRI evidence of acute osteomyelitis. Mild thumb carpometacarpal cartilage thinning and peripheral degenerative spurring. There are mild-to-moderate fourth PIP, mild-to-moderate third PIP, and mild second PIP joint effusions. Ligaments The metacarpophalangeal and interphalangeal collateral ligaments appear intact. Muscles and Tendons There is trace fluid within the third dorsal extensor tendon sheath at the level of the  metacarpophalangeal joints and base of the proximal phalanx (axial series 4, image 25) with minimal peripheral synovial enhancement. There is mild fluid within the third and fourth flexor tendon compartments at the level of the distal shaft and metacarpal head (axial series 4 images 19 through 22) with trace peripheral synovial enhancement. There appears to be a tiny defect within the region of the fourth finger extensor mechanism/PIP joint extensor expansion hood at the level of the PIP joint/head of the proximal phalanx (axial series 4, image 32, axial series 11, image 34), and it is difficult to exclude a tiny partial-thickness tear in this region, possibly from the extensor expansion hood, from the reported cat bites. This is tiny and possibly clinically insignificant. Soft tissues There is moderate edema and soft tissue swelling of the subcutaneous fat dorsal to the third through fifth metacarpals, extending from the proximal metacarpal shafts through the distal metacarpals and continuing into the third and fourth greater than second fingers. At the level of the metacarpals, this region measures up to approximately 5.5 x 0.9 x 6.1 cm in greatest transverse by AP by craniocaudal dimensions (as measured on axial series 4, image 18 and sagittal series 7, image 22). There is mild peripheral enhancement, however  no walled-off abscess is seen. This appears to represent cellulitis and developing phlegmon. There is moderate diffuse third and fourth and mild 2nd finger soft tissue enhancement. IMPRESSION: 1. There is moderate edema and soft tissue swelling of the subcutaneous fat dorsal to the third through fifth metacarpals, extending from the proximal metacarpal shafts through the distal metacarpals and continuing into the third and fourth greater than second fingers. There is mild peripheral enhancement of the greatest subcutaneous fat edema at the level of the distal metacarpals,, however no walled-off abscess is  seen. This appears to represent cellulitis and possible developing phlegmon. 2. There is trace fluid within the third extensor tendon sheath at the level of the metacarpophalangeal joints and base of the proximal phalanx with minimal peripheral synovial enhancement. There is also mild fluid within the third and fourth flexor tendon sheath at the level of the distal shaft and metacarpal head with trace peripheral synovial enhancement. MRI cannot exclude the possible presence of infection within this tendon sheath fluid. 3. Possible punctate defect within the region of the fourth finger extensor mechanism/PIP joint extensor expansion hood at the level of the PIP joint/head of the proximal phalanx. Due to the small size, this may be clinically insignificant. Recommend clinical correlation. 4. No osteomyelitis. Electronically Signed   By: Neita Garnet M.D.   On: 08/02/2022 09:07   MR HAND LEFT W WO CONTRAST  Result Date: 08/02/2022 CLINICAL DATA:  Soft tissue infection suspected. Cat bites on multiple sides of the bilateral hands and fingers with infection 07/31/2022. Multiple puncture wounds to posterior surface of fingers. EXAM: MRI OF THE LEFT HAND WITHOUT AND WITH CONTRAST TECHNIQUE: Multiplanar, multisequence MR imaging of the left hand was performed before and after the administration of intravenous contrast. CONTRAST:  8mL GADAVIST GADOBUTROL 1 MMOL/ML IV SOLN COMPARISON:  Left hand radiographs 08/01/2022 FINDINGS: Bones/Joint/Cartilage Normal marrow signal. The cortices are intact. No acute fracture. No MRI findings of acute osteomyelitis. Joint spaces are maintained. Mild thumb carpometacarpal cartilage thinning and peripheral degenerative spurring.a There is a tiny index finger PIP joint effusion (axial series 4, image 31, sagittal series 7, image 31). No associated synovial enhancement. Ligaments The collateral ligaments of the metacarpophalangeal and interphalangeal joints appear intact. Muscles and  Tendons There is trace fluid within the third and fourth extensor tendon sheaths at the level of the proximal and mid metacarpals. A focal focus of extensor tendon sheath enhancement is seen within the third finger at the level of third metacarpal neck (axial series 9, image 20). Additional mild focal synovial enhancement within the extensor tendon sheath at the level of the third finger PIP joint (axial series 9, image 33). Soft tissues There is moderate subcutaneous fat edema and swelling dorsal to the second through fourth carpometacarpal joints, dorsal to the second through fourth proximal distal metacarpals, and dorsal to the third finger proximal phalanx and more diffusely (palmar and dorsal) throughout the mid to distal third finger. There is mildly confluent fluid around the border of this region (e.g. axial series 4, image 16), greatest at the level of the mid metacarpal shafts, however this does not appear to enhance, no well-defined wall of an abscess is seen at this time. The soft tissue swelling and apparent developing phlegmon involves a region measuring up to 4.9 cm in transverse dimension (axial image 16), 0.8 cm in AP thickness (axial image 16) and 8.7 cm along the dorsal aspect of the hand at the level of the metacarpals (as measured on sagittal series  7, image 21). IMPRESSION: 1. There is moderate subcutaneous fat edema and swelling dorsal to the second through fourth metacarpals and extending into the third finger. There is mildly confluence fluid around the border of this region, greatest at the level of the mid metacarpal shafts, however this does not appear to enhance, and no well-defined wall of an abscess is seen at this time. This presumably reflects cellulitis and inflammatory phlegmon. 2. There is only trace fluid within the third and fourth extensor tendon sheaths at the level of the proximal and mid metacarpals. No tendon sheath enhancement in this region. MRI cannot exclude the  presence of infection within this fluid. 3. There is a focal focus of extensor tendon sheath enhancement within the third finger at the level of the third metacarpal neck and again focally within the extensor tendon sheath at the level of the PIP joint. 4. There is a tiny index finger PIP joint effusion. No associated synovial enhancement. 5. No MRI findings of acute osteomyelitis. Electronically Signed   By: Neita Garnet M.D.   On: 08/02/2022 08:37   DG Hand Complete Right  Result Date: 08/01/2022 CLINICAL DATA:  Multiple cat bites to hands and fingers 07/31/2022. Multiple puncture wounds. Swelling and infection. EXAM: RIGHT HAND - COMPLETE 3+ VIEW COMPARISON:  None Available. FINDINGS: Normal bone mineralization. Joint spaces are preserved. The cortices are intact. Mild soft tissue swelling and edema greatest at the dorsal aspect of the metacarpal heads. Finger soft tissue swelling greatest at the proximal third and fourth greater than second fingers. No subcutaneous air. No radiopaque foreign body. IMPRESSION: Soft tissue swelling as above. No subcutaneous air or radiopaque foreign body. No cortical erosion. Electronically Signed   By: Neita Garnet M.D.   On: 08/01/2022 20:35   DG Hand Complete Left  Result Date: 08/01/2022 CLINICAL DATA:  Soft tissue infection suspected. Cat bite of multiple sites of right hand and fingers with infection 07/31/2022. Multiple puncture wounds to posterior surface hands and fingers. EXAM: LEFT HAND - COMPLETE 3+ VIEW COMPARISON:  None Available. FINDINGS: Normal bone mineralization. Joint spaces are preserved. The cortices are intact. No subcutaneous air. No radiopaque foreign bodies are seen. Mild soft tissue swelling at the dorsal aspect of the metacarpals and of the proximal fingers, especially the third greater than second finger. IMPRESSION: Soft tissue swelling as above. No radiopaque foreign body or cortical erosion. Electronically Signed   By: Neita Garnet M.D.    On: 08/01/2022 20:33   (Echo, Carotid, EGD, Colonoscopy, ERCP)    Subjective: No complaints  Discharge Exam: Vitals:   08/12/22 1929 08/13/22 0523  BP: 115/84 107/71  Pulse: 81 60  Resp: 16 16  Temp: 97.9 F (36.6 C) 97.8 F (36.6 C)  SpO2: 100% 99%   Vitals:   08/12/22 0404 08/12/22 1242 08/12/22 1929 08/13/22 0523  BP: (!) 101/57 112/84 115/84 107/71  Pulse: (!) 57 92 81 60  Resp: 17 18 16 16   Temp: (!) 97.3 F (36.3 C) (!) 97.3 F (36.3 C) 97.9 F (36.6 C) 97.8 F (36.6 C)  TempSrc:  Oral Oral Oral  SpO2: 99% 100% 100% 99%  Weight:      Height:        General: Pt is alert, awake, not in acute distress Cardiovascular: RRR, S1/S2 +, no rubs, no gallops Respiratory: CTA bilaterally, no wheezing, no rhonchi Abdominal: Soft, NT, ND, bowel sounds + Extremities: no edema, no cyanosis    The results of significant diagnostics from this  hospitalization (including imaging, microbiology, ancillary and laboratory) are listed below for reference.     Microbiology: Recent Results (from the past 240 hour(s))  Gastrointestinal Panel by PCR , Stool     Status: None   Collection Time: 08/05/22  9:27 AM   Specimen: Stool  Result Value Ref Range Status   Campylobacter species NOT DETECTED NOT DETECTED Final   Plesimonas shigelloides NOT DETECTED NOT DETECTED Final   Salmonella species NOT DETECTED NOT DETECTED Final   Yersinia enterocolitica NOT DETECTED NOT DETECTED Final   Vibrio species NOT DETECTED NOT DETECTED Final   Vibrio cholerae NOT DETECTED NOT DETECTED Final   Enteroaggregative E coli (EAEC) NOT DETECTED NOT DETECTED Final   Enteropathogenic E coli (EPEC) NOT DETECTED NOT DETECTED Final   Enterotoxigenic E coli (ETEC) NOT DETECTED NOT DETECTED Final   Shiga like toxin producing E coli (STEC) NOT DETECTED NOT DETECTED Final   Shigella/Enteroinvasive E coli (EIEC) NOT DETECTED NOT DETECTED Final   Cryptosporidium NOT DETECTED NOT DETECTED Final   Cyclospora  cayetanensis NOT DETECTED NOT DETECTED Final   Entamoeba histolytica NOT DETECTED NOT DETECTED Final   Giardia lamblia NOT DETECTED NOT DETECTED Final   Adenovirus F40/41 NOT DETECTED NOT DETECTED Final   Astrovirus NOT DETECTED NOT DETECTED Final   Norovirus GI/GII NOT DETECTED NOT DETECTED Final   Rotavirus A NOT DETECTED NOT DETECTED Final   Sapovirus (I, II, IV, and V) NOT DETECTED NOT DETECTED Final    Comment: Performed at [facility], [address] [place], [place] [address]  Fungus Culture With Stain     Status: None (Preliminary result)   Collection Time: 08/05/22  6:58 PM   Specimen: Wound  Result Value Ref Range Status   Fungus Stain Final report  Final    Comment: (NOTE) Performed At: [facility] [address] [place], [place] [id number] Jolene Schimke MD OZ:[id number]    Fungus (Mycology) Culture PENDING  Incomplete   Fungal Source WOUND  Final    Comment: RIGHT RING FINGER PIP JOINT  Performed at [facility], [address], [place], [place] [address]   Aerobic/Anaerobic Culture w Gram Stain (surgical/deep wound)     Status: None   Collection Time: 08/05/22  6:58 PM   Specimen: Wound  Result Value Ref Range Status   Specimen Description   Final    WOUND RIGHT RING FINGER PIP JOINT CULTURE Performed at [facility], [address], [place], [place] [address]    Special Requests   Final    NONE Performed at [facility], [address] [place], [place] [address]    Gram Stain   Final    RARE WBC PRESENT,BOTH PMN AND MONONUCLEAR NO ORGANISMS SEEN    Culture   Final    No growth aerobically or anaerobically. Performed at [facility], [address] [place], [place] [address]    Report Status 08/11/2022 FINAL  Final  Acid Fast Smear (AFB)     Status: None   Collection Time: 08/05/22  6:58 PM   Specimen: Wound  Result Value Ref Range Status   AFB Specimen Processing  Concentration  Final   Acid Fast Smear Negative  Final    Comment: (NOTE) Performed At: [facility] [address] [place], [place] [id number] Jolene Schimke MD QV:[id number]    Source (AFB) WOUND  Final    Comment: RIGHT RING FINGER PIP JOINT  Performed at [facility],  [address] [place], [place] [address]   Fungus Culture Result     Status: None   Collection Time: 08/05/22  6:58 PM  Result Value Ref Range Status   Result 1 Comment  Final    Comment: (NOTE) KOH/Calcofluor preparation:  no fungus observed. Performed At: [facility] [address] [place], [place] [id number] Jolene Schimke MD XB:[id number]   Fungus Culture With Stain     Status: None (Preliminary result)   Collection Time: 08/05/22  7:15 PM   Specimen: Wound  Result Value Ref Range Status   Fungus Stain Final report  Final    Comment: (NOTE) Performed At: [facility] [address] [place], [place] [id number] Jolene Schimke MD NG:[id number]    Fungus (Mycology) Culture PENDING  Incomplete   Fungal Source WOUND  Final    Comment: RIGHT RING FINGER PIP JOINT  Performed at [facility], [address], [place], [place] [address]   Aerobic/Anaerobic Culture w Gram Stain (surgical/deep wound)     Status: None   Collection Time: 08/05/22  7:15 PM   Specimen: Wound  Result Value Ref Range Status   Specimen Description   Final    WOUND RIGHT RING FINGER PIP JOINT Performed at [facility], [address] [place], [place] [address]    Special Requests   Final    NONE Performed at [facility], [address], [place], [place] [address]    Gram Stain NO WBC SEEN NO ORGANISMS SEEN   Final   Culture   Final    No growth aerobically or anaerobically. Performed at [facility], [address], [place], [place] [address]    Report Status 08/11/2022 FINAL  Final  Acid Fast Smear (AFB)     Status: None    Collection Time: 08/05/22  7:15 PM   Specimen: Wound  Result Value Ref Range Status   AFB Specimen Processing Concentration  Final   Acid Fast Smear Negative  Final    Comment: (NOTE) Performed At: [facility] [address] [place], [place] [id number] Jolene Schimke MD PP:[id number]    Source (AFB) WOUND  Final    Comment: RIGHT RING FINGER PIP JOINT  Performed at [facility], [address], [place], [place] [address]   Fungus Culture Result     Status: None   Collection Time: 08/05/22  7:15 PM  Result Value Ref Range Status   Result 1 Comment  Final    Comment: (NOTE) KOH/Calcofluor preparation:  no fungus observed. Performed At: [facility] [address] [place], [place] [id number] Jolene Schimke MD TD:[id number]      Labs: BNP (last 3 results) No results for input(s): "BNP" in the last 8760 hours. Basic Metabolic Panel: Recent Labs  Lab 08/07/22 0537  NA 137  K 4.0  CL 106  CO2 25  GLUCOSE 83  BUN 15  CREATININE 0.68  CALCIUM 8.3*   Liver Function Tests: No results for input(s): "AST", "ALT", "ALKPHOS", "BILITOT", "PROT", "ALBUMIN" in the last 168 hours. No results for input(s): "LIPASE", "AMYLASE" in the last 168 hours. No results for input(s): "AMMONIA" in the last 168 hours. CBC: Recent Labs  Lab 08/07/22 0537 08/12/22 0619  WBC 7.0 6.1  NEUTROABS  --  2.8  HGB 12.4 13.7  HCT 38.3 41.4  MCV 95.8 95.8  PLT 213 205   Cardiac Enzymes: No results for input(s): "CKTOTAL", "CKMB", "CKMBINDEX", "TROPONINI" in the last  168 hours. BNP: Invalid input(s): "POCBNP" CBG: No results for input(s): "GLUCAP" in the last 168 hours. D-Dimer No results for input(s): "DDIMER" in the last 72 hours. Hgb A1c No results for input(s): "HGBA1C" in the last 72 hours. Lipid Profile No results for input(s): "CHOL", "HDL", "LDLCALC", "TRIG", "CHOLHDL", "LDLDIRECT" in the last 72 hours. Thyroid function studies No results for  input(s): "TSH", "T4TOTAL", "T3FREE", "THYROIDAB" in the last 72 hours.  Invalid input(s): "FREET3" Anemia work up No results for input(s): "VITAMINB12", "FOLATE", "FERRITIN", "TIBC", "IRON", "RETICCTPCT" in the last 72 hours. Urinalysis No results found for: "COLORURINE", "APPEARANCEUR", "LABSPEC", "PHURINE", "GLUCOSEU", "HGBUR", "BILIRUBINUR", "KETONESUR", "PROTEINUR", "UROBILINOGEN", "NITRITE", "LEUKOCYTESUR" Sepsis Labs Recent Labs  Lab 08/07/22 0537 08/12/22 0619  WBC 7.0 6.1   Microbiology Recent Results (from the past 240 hour(s))  Gastrointestinal Panel by PCR , Stool     Status: None   Collection Time: 08/05/22  9:27 AM   Specimen: Stool  Result Value Ref Range Status   Campylobacter species NOT DETECTED NOT DETECTED Final   Plesimonas shigelloides NOT DETECTED NOT DETECTED Final   Salmonella species NOT DETECTED NOT DETECTED Final   Yersinia enterocolitica NOT DETECTED NOT DETECTED Final   Vibrio species NOT DETECTED NOT DETECTED Final   Vibrio cholerae NOT DETECTED NOT DETECTED Final   Enteroaggregative E coli (EAEC) NOT DETECTED NOT DETECTED Final   Enteropathogenic E coli (EPEC) NOT DETECTED NOT DETECTED Final   Enterotoxigenic E coli (ETEC) NOT DETECTED NOT DETECTED Final   Shiga like toxin producing E coli (STEC) NOT DETECTED NOT DETECTED Final   Shigella/Enteroinvasive E coli (EIEC) NOT DETECTED NOT DETECTED Final   Cryptosporidium NOT DETECTED NOT DETECTED Final   Cyclospora cayetanensis NOT DETECTED NOT DETECTED Final   Entamoeba histolytica NOT DETECTED NOT DETECTED Final   Giardia lamblia NOT DETECTED NOT DETECTED Final   Adenovirus F40/41 NOT DETECTED NOT DETECTED Final   Astrovirus NOT DETECTED NOT DETECTED Final   Norovirus GI/GII NOT DETECTED NOT DETECTED Final   Rotavirus A NOT DETECTED NOT DETECTED Final   Sapovirus (I, II, IV, and V) NOT DETECTED NOT DETECTED Final    Comment: Performed at [facility], [address] [place], [place]  [address]  Fungus Culture With Stain     Status: None (Preliminary result)   Collection Time: 08/05/22  6:58 PM   Specimen: Wound  Result Value Ref Range Status   Fungus Stain Final report  Final    Comment: (NOTE) Performed At: [facility] [address] [place], [place] [id number] Jolene Schimke MD NW:[id number]    Fungus (Mycology) Culture PENDING  Incomplete   Fungal Source WOUND  Final    Comment: RIGHT RING FINGER PIP JOINT  Performed at [facility], [address] [place], [place] [address]   Aerobic/Anaerobic Culture w Gram Stain (surgical/deep wound)     Status: None   Collection Time: 08/05/22  6:58 PM   Specimen: Wound  Result Value Ref Range Status   Specimen Description   Final    WOUND RIGHT RING FINGER PIP JOINT CULTURE Performed at [facility], [address] [place], [place] [address]    Special Requests   Final    NONE Performed at [facility], [address] [place], [place] [address]    Gram Stain   Final    RARE WBC PRESENT,BOTH PMN AND MONONUCLEAR NO ORGANISMS SEEN    Culture   Final    No growth aerobically or  anaerobically. Performed at [facility], [address], [place], [place] [address]    Report Status 08/11/2022 FINAL  Final  Acid Fast Smear (AFB)     Status: None   Collection Time: 08/05/22  6:58 PM   Specimen: Wound  Result Value Ref Range Status   AFB Specimen Processing Concentration  Final   Acid Fast Smear Negative  Final    Comment: (NOTE) Performed At: [facility] [address] [place], [place] [id number] Jolene Schimke MD [id number]    Source (AFB) WOUND  Final    Comment: RIGHT RING FINGER PIP JOINT  Performed at [facility], [address], [place], [place] [address]   Fungus Culture Result     Status: None   Collection Time: 08/05/22  6:58 PM  Result Value Ref Range Status   Result 1 Comment  Final    Comment:  (NOTE) KOH/Calcofluor preparation:  no fungus observed. Performed At: [facility] [address] [place], [place] [id number] Jolene Schimke MD [id number]   Fungus Culture With Stain     Status: None (Preliminary result)   Collection Time: 08/05/22  7:15 PM   Specimen: Wound  Result Value Ref Range Status   Fungus Stain Final report  Final    Comment: (NOTE) Performed At: [facility] [address] [place], [place] [id number] Jolene Schimke MD [id number]    Fungus (Mycology) Culture PENDING  Incomplete   Fungal Source WOUND  Final    Comment: RIGHT RING FINGER PIP JOINT  Performed at [facility], [address] [place], [place] [address]   Aerobic/Anaerobic Culture w Gram Stain (surgical/deep wound)     Status: None   Collection Time: 08/05/22  7:15 PM   Specimen: Wound  Result Value Ref Range Status   Specimen Description   Final    WOUND RIGHT RING FINGER PIP JOINT Performed at [facility], [address] [place], [place] [address]    Special Requests   Final    NONE Performed at [facility], [address], [place], [place] [address]    Gram Stain NO WBC SEEN NO ORGANISMS SEEN   Final   Culture   Final    No growth aerobically or anaerobically. Performed at [facility], [address], [place], [place] [address]    Report Status 08/11/2022 FINAL  Final  Acid Fast Smear (AFB)     Status: None   Collection Time: 08/05/22  7:15 PM   Specimen: Wound  Result Value Ref Range Status   AFB Specimen Processing Concentration  Final   Acid Fast Smear Negative  Final    Comment: (NOTE) Performed At: [facility] [address] [place], [place] [id number] Jolene Schimke MD [id number]    Source (AFB) WOUND  Final    Comment: RIGHT RING FINGER PIP JOINT  Performed at [facility] [address], [place], [place] [address]   Fungus Culture Result     Status: None    Collection Time: 08/05/22  7:15 PM  Result Value Ref Range Status   Result 1 Comment  Final    Comment: (NOTE) KOH/Calcofluor preparation:  no fungus observed. Performed At: [facility] [address] [place], [place] [id number] Jolene Schimke MD [id number]:[id number]     SIGNED:   Marinda Elk, MD  [facility] 08/13/2022, 10:20 AM Pager   If 7PM-7AM, please contact night-coverage [email or web address] Password [email or web address]

## 2022-08-15 DIAGNOSIS — Z2914 Encounter for prophylactic rabies immune globin: Secondary | ICD-10-CM | POA: Diagnosis not present

## 2022-08-15 DIAGNOSIS — W5501XA Bitten by cat, initial encounter: Secondary | ICD-10-CM | POA: Diagnosis not present

## 2022-08-15 DIAGNOSIS — S61250A Open bite of right index finger without damage to nail, initial encounter: Secondary | ICD-10-CM | POA: Diagnosis not present

## 2022-08-15 DIAGNOSIS — S61252A Open bite of right middle finger without damage to nail, initial encounter: Secondary | ICD-10-CM | POA: Diagnosis not present

## 2022-08-15 DIAGNOSIS — Z203 Contact with and (suspected) exposure to rabies: Secondary | ICD-10-CM | POA: Diagnosis not present

## 2022-08-20 DIAGNOSIS — F4323 Adjustment disorder with mixed anxiety and depressed mood: Secondary | ICD-10-CM | POA: Diagnosis not present

## 2022-08-27 ENCOUNTER — Other Ambulatory Visit: Payer: Self-pay

## 2022-08-27 ENCOUNTER — Ambulatory Visit (INDEPENDENT_AMBULATORY_CARE_PROVIDER_SITE_OTHER): Payer: BC Managed Care – PPO | Admitting: Internal Medicine

## 2022-08-27 ENCOUNTER — Encounter: Payer: Self-pay | Admitting: Internal Medicine

## 2022-08-27 VITALS — BP 114/73 | HR 87 | Temp 98.7°F | Ht 67.0 in | Wt 183.0 lb

## 2022-08-27 DIAGNOSIS — W5501XD Bitten by cat, subsequent encounter: Secondary | ICD-10-CM | POA: Diagnosis not present

## 2022-08-27 DIAGNOSIS — M659 Synovitis and tenosynovitis, unspecified: Secondary | ICD-10-CM

## 2022-08-27 DIAGNOSIS — M65841 Other synovitis and tenosynovitis, right hand: Secondary | ICD-10-CM

## 2022-08-27 NOTE — Progress Notes (Signed)
[facility]  Patient Active Problem List   Diagnosis Date Noted   Septic arthritis of interphalangeal joint of finger of right hand (HCC) 08/12/2022   Medication management 08/12/2022   Cat bite of multiple sites of left hand and fingers with infection 08/01/2022   Cat bite of right hand including fingers with infection 08/01/2022      Subjective:    Patient ID: Yesenia Long, female    DOB: 1986/01/18, 37 y.o.   MRN: [id number]  Chief Complaint  Patient presents with   Follow-up    Night sweats; decreased mobility right fingers (ring and middle)     HPI:  Yesenia Long is a 37 y.o. female here for hospital f/u cat bite  She had I&D. Still had pain/swelling after. Mri f/u done during admission no sign of om She was discharged on 4 weeks abx amox-clav. She has about another week   Redness gone However still swelling/pain   No n/v/diarrhea  No Known Allergies    Outpatient Medications Prior to Visit  Medication Sig Dispense Refill   amoxicillin-clavulanate (AUGMENTIN) 875-125 MG tablet Take 1 tablet by mouth 2 (two) times daily for 22 days. 44 tablet 0   escitalopram (LEXAPRO) 10 MG tablet Take 10 mg by mouth daily.     hydrOXYzine (ATARAX) 25 MG tablet Take 25 mg by mouth 2 (two) times daily as needed.     naproxen (NAPROSYN) 500 MG tablet Take 500 mg by mouth 2 (two) times daily.     No facility-administered medications prior to visit.     Social History   Socioeconomic History   Marital status: Single    Spouse name: Not on file   Number of children: Not on file   Years of education: Not on file   Highest education level: Not on file  Occupational History   Not on file  Tobacco Use   Smoking status: Unknown   Smokeless tobacco: Not on file  Substance and Sexual Activity   Alcohol use: Never   Drug use: Never   Sexual activity: Not on file  Other Topics Concern   Not on file  Social History Narrative   Not  on file   Social Determinants of Health   Financial Resource Strain: Not on file  Food Insecurity: No Food Insecurity (08/01/2022)   Hunger Vital Sign    Worried About Running Out of Food in the Last Year: Never true    Ran Out of Food in the Last Year: Never true  Transportation Needs: No Transportation Needs (08/01/2022)   PRAPARE - Administrator, Civil Service (Medical): No    Lack of Transportation (Non-Medical): No  Physical Activity: Not on file  Stress: Not on file  Social Connections: Not on file  Intimate Partner Violence: Not At Risk (08/01/2022)   Humiliation, Afraid, Rape, and Kick questionnaire    Fear of Current or Ex-Partner: No    Emotionally Abused: No    Physically Abused: No    Sexually Abused: No      Review of Systems    All other ros negative  Objective:    BP 114/73   Pulse 87   Temp 98.7 F (37.1 C) (Temporal)   Ht 5\' 7"  (1.702 m)   Wt 183 lb (83 kg)   SpO2 96%   BMI 28.66 kg/m  Nursing note and vital signs reviewed.  Physical Exam     General/constitutional: no  distress, pleasant HEENT: Normocephalic, PER, Conj Clear, EOMI, Oropharynx clear Neck supple CV: rrr no mrg Lungs: clear to auscultation, normal respiratory effort Abd: Soft, Nontender Ext: no edema Skin: No Rash Neuro: nonfocal MSK: no effusion detected on pip joint         Labs:  Micro:  Serology:  Imaging:  Assessment & Plan:   Problem List Items Addressed This Visit   None Visit Diagnoses     Tenosynovitis    -  Primary   Relevant Orders   C-reactive protein   COMPLETE METABOLIC PANEL WITH GFR   Cat bite, subsequent encounter       Relevant Orders   C-reactive protein   COMPLETE METABOLIC PANEL WITH GFR         No orders of the defined types were placed in this encounter.    Abx: 5/6-c amox-clav  5/2-5/6 amp-sulb   4/25-5/2 metronidazole 4/25-5/2 ceftriaxone   Outpatient amox-clav   Assessment: 37 yo female with cat  bite bilateral fingers and associated soft tissue infection, s/p I&D of right 3rd and 4th finger   Initial cat bite 4/24. Given amox-clav (child hood pcn allergy but tolerated amox-clav) in urgent care same day prophylaxis. Cellulitis bilateral hand developed despite 2 days amox-clav   Admitted 4/25 for progression of LUE cellulitis and right 3rd/4th finger pain/swelling/redness   4/25 admission bcx negative S/p bedside I&D 4/26 right hand -- cx negative S/p I&D 4/29 right 3rd and 4th finger with pip joint -- cx ngtd. Operative note reviewed. No purulent material encountered.    Patient with stable swelling/redness of the 4th finger still on right side as of today POD #3     4/25 admission mri bilateral hands soft tissue swelling but no OM finding. However, this is obtained rather early in the course   I do not appreciate any worsening at this time and it might take several days for improvement      --------------- 08/09/22 assessment Stable finding right 4th pip swelling and mild-moderate tenderness -- no dehiscence/purulence Afebrile HS-Crp normal   Tolerating amp-sulb    She has gotten passive rabies vaccination; her cat observed >10 days is fine   5/21 id clinic assessment The redness is much better, however, focal neuropathic like pain on dorsum of ring finger proximal IP joint Exam is not consistent with effusion however  Will finish the 4th week of antibiotics amox-clav Recheck crp F/u with me 1 week after abx finished  F/u ortho as discussed  Follow-up: Return in about 2 weeks (around 09/10/2022).      Raymondo Band, MD [facility] 08/27/2022, 2:02 PM

## 2022-08-27 NOTE — Patient Instructions (Signed)
There are mixed changes but I am leaning toward that your infection is rather well suppressed   Please finish 1 more week of antbiotics and see me in 2 weeks  Labs today

## 2022-08-28 LAB — COMPLETE METABOLIC PANEL WITH GFR
AG Ratio: 2.1 (calc) (ref 1.0–2.5)
ALT: 20 U/L (ref 6–29)
AST: 15 U/L (ref 10–30)
Albumin: 4.5 g/dL (ref 3.6–5.1)
Alkaline phosphatase (APISO): 47 U/L (ref 31–125)
BUN/Creatinine Ratio: 12 (calc) (ref 6–22)
BUN: 14 mg/dL (ref 7–25)
CO2: 25 mmol/L (ref 20–32)
Calcium: 9.9 mg/dL (ref 8.6–10.2)
Chloride: 104 mmol/L (ref 98–110)
Creat: 1.17 mg/dL — ABNORMAL HIGH (ref 0.50–0.97)
Globulin: 2.1 g/dL (calc) (ref 1.9–3.7)
Glucose, Bld: 88 mg/dL (ref 65–99)
Potassium: 4.5 mmol/L (ref 3.5–5.3)
Sodium: 139 mmol/L (ref 135–146)
Total Bilirubin: 0.3 mg/dL (ref 0.2–1.2)
Total Protein: 6.6 g/dL (ref 6.1–8.1)
eGFR: 61 mL/min/{1.73_m2} (ref >=60)

## 2022-08-28 LAB — C-REACTIVE PROTEIN: CRP: <3 mg/L (ref <8.0)

## 2022-08-30 ENCOUNTER — Telehealth: Payer: Self-pay

## 2022-08-30 NOTE — Telephone Encounter (Signed)
Spoke with patient, relayed that CRP continues to be normal and that Dr. Renold Don would like for her to finish the week of Augmentin. Reminded her of follow up next month. Patient verbalized understanding and has no further questions.   Sandie Ano, RN

## 2022-08-30 NOTE — Telephone Encounter (Signed)
-----   Message from Raymondo Band, MD sent at 08/29/2022  5:07 PM EDT ----- Hi team Please let her know that her crp continues to be normal so let's finish the week of the augmentin and revisit as we discussed   thanks

## 2022-09-03 LAB — FUNGUS CULTURE RESULT

## 2022-09-03 LAB — FUNGUS CULTURE WITH STAIN

## 2022-09-03 LAB — FUNGAL ORGANISM REFLEX

## 2022-09-04 LAB — FUNGUS CULTURE WITH STAIN

## 2022-09-04 LAB — FUNGUS CULTURE RESULT

## 2022-09-04 LAB — FUNGAL ORGANISM REFLEX

## 2022-09-16 DIAGNOSIS — L089 Local infection of the skin and subcutaneous tissue, unspecified: Secondary | ICD-10-CM | POA: Diagnosis not present

## 2022-09-19 ENCOUNTER — Encounter: Payer: Self-pay | Admitting: Internal Medicine

## 2022-09-19 ENCOUNTER — Other Ambulatory Visit: Payer: Self-pay

## 2022-09-19 ENCOUNTER — Ambulatory Visit (INDEPENDENT_AMBULATORY_CARE_PROVIDER_SITE_OTHER): Payer: BC Managed Care – PPO | Admitting: Internal Medicine

## 2022-09-19 VITALS — BP 106/78 | HR 68 | Resp 16 | Ht 67.0 in | Wt 181.0 lb

## 2022-09-19 DIAGNOSIS — M659 Synovitis and tenosynovitis, unspecified: Secondary | ICD-10-CM

## 2022-09-19 DIAGNOSIS — W5501XD Bitten by cat, subsequent encounter: Secondary | ICD-10-CM | POA: Diagnosis not present

## 2022-09-19 NOTE — Patient Instructions (Signed)
F/u ortho as needed No sign of infection off abx

## 2022-09-19 NOTE — Progress Notes (Signed)
[facility]  Patient Active Problem List   Diagnosis Date Noted   Septic arthritis of interphalangeal joint of finger of right hand (HCC) 08/12/2022   Medication management 08/12/2022   Cat bite of multiple sites of left hand and fingers with infection 08/01/2022   Cat bite of right hand including fingers with infection 08/01/2022      Subjective:    Patient ID: Yesenia Long, female    DOB: 1985/11/05, 37 y.o.   MRN: [id number]  No chief complaint on file.   HPI:  Yesenia Long is a 37 y.o. female here for hospital f/u cat bite  She had I&D. Still had pain/swelling after. Mri f/u done during admission no sign of om She was discharged on 4 weeks abx amox-clav. She has about another week   Redness gone However still swelling/pain   No n/v/diarrhea  09/19/22 id clinic f/u Finish abx about 3 weeks ago. Doing pt/ot Pain still there especially with quick poking/touching of area. Not red, swollen, fever, chill Her surgeon planning potentially to reexplore and take care of what appears like scar tissue    No Known Allergies    Outpatient Medications Prior to Visit  Medication Sig Dispense Refill   escitalopram (LEXAPRO) 10 MG tablet Take 10 mg by mouth daily.     hydrOXYzine (ATARAX) 25 MG tablet Take 25 mg by mouth 2 (two) times daily as needed.     Levonorgestrel (MIRENA, 52 MG, IU) as directed Intrauterine     naproxen (NAPROSYN) 500 MG tablet Take 500 mg by mouth 2 (two) times daily.     No facility-administered medications prior to visit.     Social History   Socioeconomic History   Marital status: Single    Spouse name: Not on file   Number of children: Not on file   Years of education: Not on file   Highest education level: Not on file  Occupational History   Not on file  Tobacco Use   Smoking status: Unknown   Smokeless tobacco: Not on file  Substance and Sexual Activity   Alcohol use: Never   Drug use:  Never   Sexual activity: Not on file  Other Topics Concern   Not on file  Social History Narrative   Not on file   Social Determinants of Health   Financial Resource Strain: Not on file  Food Insecurity: No Food Insecurity (08/01/2022)   Hunger Vital Sign    Worried About Running Out of Food in the Last Year: Never true    Ran Out of Food in the Last Year: Never true  Transportation Needs: No Transportation Needs (08/01/2022)   PRAPARE - Administrator, Civil Service (Medical): No    Lack of Transportation (Non-Medical): No  Physical Activity: Not on file  Stress: Not on file  Social Connections: Not on file  Intimate Partner Violence: Not At Risk (08/01/2022)   Humiliation, Afraid, Rape, and Kick questionnaire    Fear of Current or Ex-Partner: No    Emotionally Abused: No    Physically Abused: No    Sexually Abused: No      Review of Systems    All other ros negative  Objective:    Ht 5\' 7"  (1.702 m)   Wt 181 lb (82.1 kg)   BMI 28.35 kg/m  Nursing note and vital signs reviewed.  Physical Exam     General/constitutional: no distress, pleasant HEENT: Normocephalic,  PER, Conj Clear, EOMI, Oropharynx clear Neck supple CV: rrr no mrg Lungs: clear to auscultation, normal respiratory effort Abd: Soft, Nontender Ext: no edema Skin: No Rash Neuro: nonfocal MSK: no effusion detected on pip joint       09/19/22 The proximal ip joint of the right index finger remains same --> tender to moderate palpation no fluctuance; limited rom; no redness/warmth   Labs: Lab Results  Component Value Date   WBC 6.1 08/12/2022   HGB 13.7 08/12/2022   HCT 41.4 08/12/2022   MCV 95.8 08/12/2022   PLT 205 08/12/2022   Last metabolic panel Lab Results  Component Value Date   GLUCOSE 88 08/27/2022   NA 139 08/27/2022   K 4.5 08/27/2022   CL 104 08/27/2022   CO2 25 08/27/2022   BUN 14 08/27/2022   CREATININE 1.17 (H) 08/27/2022   EGFR 61 08/27/2022   CALCIUM  9.9 08/27/2022   PROT 6.6 08/27/2022   ALBUMIN 4.7 08/01/2022   BILITOT 0.3 08/27/2022   ALKPHOS 45 08/01/2022   AST 15 08/27/2022   ALT 20 08/27/2022   ANIONGAP 6 08/07/2022      Component Value Date/Time   CRP <3.0 08/27/2022 0215   CRP 0.6 08/13/2022 0556   CRP <0.5 08/12/2022 1610    Micro:  Serology:  Imaging:  Assessment & Plan:   Problem List Items Addressed This Visit   None Visit Diagnoses     Tenosynovitis    -  Primary   Cat bite, subsequent encounter            No orders of the defined types were placed in this encounter.    Abx: 5/6-5/27 amox-clav  5/2-5/6 amp-sulb   4/25-5/2 metronidazole 4/25-5/2 ceftriaxone   Outpatient amox-clav   Assessment: 37 yo female with cat bite bilateral fingers and associated soft tissue infection, s/p I&D of right 3rd and 4th finger   Initial cat bite 4/24. Given amox-clav (child hood pcn allergy but tolerated amox-clav) in urgent care same day prophylaxis. Cellulitis bilateral hand developed despite 2 days amox-clav   Admitted 4/25 for progression of LUE cellulitis and right 3rd/4th finger pain/swelling/redness   4/25 admission bcx negative S/p bedside I&D 4/26 right hand -- cx negative S/p I&D 4/29 right 3rd and 4th finger with pip joint -- cx ngtd. Operative note reviewed. No purulent material encountered.    Patient with stable swelling/redness of the 4th finger still on right side as of today POD #3     4/25 admission mri bilateral hands soft tissue swelling but no OM finding. However, this is obtained rather early in the course   I do not appreciate any worsening at this time and it might take several days for improvement      --------------- 08/09/22 assessment Stable finding right 4th pip swelling and mild-moderate tenderness -- no dehiscence/purulence Afebrile HS-Crp normal   Tolerating amp-sulb    She has gotten passive rabies vaccination; her cat observed >10 days is fine   5/21 id  clinic assessment The redness is much better, however, focal neuropathic like pain on dorsum of ring finger proximal IP joint Exam is not consistent with effusion however  Will finish the 4th week of antibiotics amox-clav Recheck crp F/u with me 1 week after abx finished  F/u ortho as discussed   09/19/22 id clinic assessment Almost 3 weeks off abx finger not worse, stable from last visit Per patient reports her surgeon suspect scar tissue and plans to do some reexploration  after pt/ot No sign of infection  F/u as needed if fever, chill, fluctuance in the joint or redness, rapidly increasing pain    Follow-up: Return if symptoms worsen or fail to improve.      Raymondo Band, MD [facility] 09/19/2022, 11:32 AM

## 2022-09-20 DIAGNOSIS — M25641 Stiffness of right hand, not elsewhere classified: Secondary | ICD-10-CM | POA: Diagnosis not present

## 2022-09-20 DIAGNOSIS — L089 Local infection of the skin and subcutaneous tissue, unspecified: Secondary | ICD-10-CM | POA: Diagnosis not present

## 2022-09-20 DIAGNOSIS — Z789 Other specified health status: Secondary | ICD-10-CM | POA: Diagnosis not present

## 2022-09-21 LAB — ACID FAST CULTURE WITH REFLEXED SENSITIVITIES (MYCOBACTERIA): Acid Fast Culture: NEGATIVE

## 2022-09-23 LAB — ACID FAST CULTURE WITH REFLEXED SENSITIVITIES (MYCOBACTERIA): Acid Fast Culture: NEGATIVE

## 2022-09-25 LAB — ACID FAST CULTURE WITH REFLEXED SENSITIVITIES (MYCOBACTERIA): Acid Fast Culture: NEGATIVE

## 2022-09-27 DIAGNOSIS — M25641 Stiffness of right hand, not elsewhere classified: Secondary | ICD-10-CM | POA: Diagnosis not present

## 2022-09-27 DIAGNOSIS — L089 Local infection of the skin and subcutaneous tissue, unspecified: Secondary | ICD-10-CM | POA: Diagnosis not present

## 2022-09-27 DIAGNOSIS — Z789 Other specified health status: Secondary | ICD-10-CM | POA: Diagnosis not present

## 2022-10-02 DIAGNOSIS — Z789 Other specified health status: Secondary | ICD-10-CM | POA: Diagnosis not present

## 2022-10-02 DIAGNOSIS — M25641 Stiffness of right hand, not elsewhere classified: Secondary | ICD-10-CM | POA: Diagnosis not present

## 2022-10-02 DIAGNOSIS — L089 Local infection of the skin and subcutaneous tissue, unspecified: Secondary | ICD-10-CM | POA: Diagnosis not present

## 2022-10-09 DIAGNOSIS — L089 Local infection of the skin and subcutaneous tissue, unspecified: Secondary | ICD-10-CM | POA: Diagnosis not present

## 2022-10-09 DIAGNOSIS — M25641 Stiffness of right hand, not elsewhere classified: Secondary | ICD-10-CM | POA: Diagnosis not present

## 2022-10-09 DIAGNOSIS — Z789 Other specified health status: Secondary | ICD-10-CM | POA: Diagnosis not present

## 2022-10-11 DIAGNOSIS — Z13220 Encounter for screening for lipoid disorders: Secondary | ICD-10-CM | POA: Diagnosis not present

## 2022-10-11 DIAGNOSIS — Z0000 Encounter for general adult medical examination without abnormal findings: Secondary | ICD-10-CM | POA: Diagnosis not present

## 2022-10-11 DIAGNOSIS — G8929 Other chronic pain: Secondary | ICD-10-CM | POA: Diagnosis not present

## 2022-10-11 DIAGNOSIS — F419 Anxiety disorder, unspecified: Secondary | ICD-10-CM | POA: Diagnosis not present

## 2022-10-16 DIAGNOSIS — Z789 Other specified health status: Secondary | ICD-10-CM | POA: Diagnosis not present

## 2022-10-16 DIAGNOSIS — L089 Local infection of the skin and subcutaneous tissue, unspecified: Secondary | ICD-10-CM | POA: Diagnosis not present

## 2022-10-16 DIAGNOSIS — M25641 Stiffness of right hand, not elsewhere classified: Secondary | ICD-10-CM | POA: Diagnosis not present

## 2022-10-29 DIAGNOSIS — M25641 Stiffness of right hand, not elsewhere classified: Secondary | ICD-10-CM | POA: Diagnosis not present

## 2022-10-29 DIAGNOSIS — L089 Local infection of the skin and subcutaneous tissue, unspecified: Secondary | ICD-10-CM | POA: Diagnosis not present

## 2022-10-29 DIAGNOSIS — Z789 Other specified health status: Secondary | ICD-10-CM | POA: Diagnosis not present

## 2022-10-31 DIAGNOSIS — L089 Local infection of the skin and subcutaneous tissue, unspecified: Secondary | ICD-10-CM | POA: Diagnosis not present

## 2022-10-31 DIAGNOSIS — M24541 Contracture, right hand: Secondary | ICD-10-CM | POA: Diagnosis not present

## 2022-11-01 DIAGNOSIS — M25641 Stiffness of right hand, not elsewhere classified: Secondary | ICD-10-CM | POA: Diagnosis not present

## 2022-11-01 DIAGNOSIS — L089 Local infection of the skin and subcutaneous tissue, unspecified: Secondary | ICD-10-CM | POA: Diagnosis not present

## 2022-11-01 DIAGNOSIS — Z789 Other specified health status: Secondary | ICD-10-CM | POA: Diagnosis not present

## 2022-11-08 DIAGNOSIS — Z789 Other specified health status: Secondary | ICD-10-CM | POA: Diagnosis not present

## 2022-11-08 DIAGNOSIS — M25641 Stiffness of right hand, not elsewhere classified: Secondary | ICD-10-CM | POA: Diagnosis not present

## 2022-11-08 DIAGNOSIS — L089 Local infection of the skin and subcutaneous tissue, unspecified: Secondary | ICD-10-CM | POA: Diagnosis not present

## 2022-11-11 DIAGNOSIS — Z01419 Encounter for gynecological examination (general) (routine) without abnormal findings: Secondary | ICD-10-CM | POA: Diagnosis not present

## 2022-11-11 DIAGNOSIS — Z124 Encounter for screening for malignant neoplasm of cervix: Secondary | ICD-10-CM | POA: Diagnosis not present

## 2022-11-15 DIAGNOSIS — M25641 Stiffness of right hand, not elsewhere classified: Secondary | ICD-10-CM | POA: Diagnosis not present

## 2022-11-15 DIAGNOSIS — L089 Local infection of the skin and subcutaneous tissue, unspecified: Secondary | ICD-10-CM | POA: Diagnosis not present

## 2022-11-15 DIAGNOSIS — Z789 Other specified health status: Secondary | ICD-10-CM | POA: Diagnosis not present

## 2022-11-22 DIAGNOSIS — L089 Local infection of the skin and subcutaneous tissue, unspecified: Secondary | ICD-10-CM | POA: Diagnosis not present

## 2022-11-22 DIAGNOSIS — Z789 Other specified health status: Secondary | ICD-10-CM | POA: Diagnosis not present

## 2022-11-22 DIAGNOSIS — M25641 Stiffness of right hand, not elsewhere classified: Secondary | ICD-10-CM | POA: Diagnosis not present

## 2022-11-29 DIAGNOSIS — M25641 Stiffness of right hand, not elsewhere classified: Secondary | ICD-10-CM | POA: Diagnosis not present

## 2022-11-29 DIAGNOSIS — Z789 Other specified health status: Secondary | ICD-10-CM | POA: Diagnosis not present

## 2022-11-29 DIAGNOSIS — L089 Local infection of the skin and subcutaneous tissue, unspecified: Secondary | ICD-10-CM | POA: Diagnosis not present

## 2022-12-04 DIAGNOSIS — L089 Local infection of the skin and subcutaneous tissue, unspecified: Secondary | ICD-10-CM | POA: Diagnosis not present

## 2022-12-04 DIAGNOSIS — M25641 Stiffness of right hand, not elsewhere classified: Secondary | ICD-10-CM | POA: Diagnosis not present

## 2022-12-13 DIAGNOSIS — L089 Local infection of the skin and subcutaneous tissue, unspecified: Secondary | ICD-10-CM | POA: Diagnosis not present

## 2022-12-13 DIAGNOSIS — Z789 Other specified health status: Secondary | ICD-10-CM | POA: Diagnosis not present

## 2022-12-13 DIAGNOSIS — M25641 Stiffness of right hand, not elsewhere classified: Secondary | ICD-10-CM | POA: Diagnosis not present

## 2022-12-27 DIAGNOSIS — M25641 Stiffness of right hand, not elsewhere classified: Secondary | ICD-10-CM | POA: Diagnosis not present

## 2022-12-27 DIAGNOSIS — L089 Local infection of the skin and subcutaneous tissue, unspecified: Secondary | ICD-10-CM | POA: Diagnosis not present

## 2022-12-27 DIAGNOSIS — Z789 Other specified health status: Secondary | ICD-10-CM | POA: Diagnosis not present

## 2023-01-01 DIAGNOSIS — F4323 Adjustment disorder with mixed anxiety and depressed mood: Secondary | ICD-10-CM | POA: Diagnosis not present

## 2023-01-06 DIAGNOSIS — D692 Other nonthrombocytopenic purpura: Secondary | ICD-10-CM | POA: Diagnosis not present

## 2023-01-06 DIAGNOSIS — R21 Rash and other nonspecific skin eruption: Secondary | ICD-10-CM | POA: Diagnosis not present

## 2023-01-06 DIAGNOSIS — M79621 Pain in right upper arm: Secondary | ICD-10-CM | POA: Diagnosis not present

## 2023-01-06 DIAGNOSIS — L0100 Impetigo, unspecified: Secondary | ICD-10-CM | POA: Diagnosis not present

## 2023-01-10 DIAGNOSIS — M25641 Stiffness of right hand, not elsewhere classified: Secondary | ICD-10-CM | POA: Diagnosis not present

## 2023-01-10 DIAGNOSIS — L089 Local infection of the skin and subcutaneous tissue, unspecified: Secondary | ICD-10-CM | POA: Diagnosis not present

## 2023-01-10 DIAGNOSIS — M79644 Pain in right finger(s): Secondary | ICD-10-CM | POA: Diagnosis not present

## 2023-01-10 DIAGNOSIS — Z789 Other specified health status: Secondary | ICD-10-CM | POA: Diagnosis not present

## 2023-01-16 DIAGNOSIS — F4323 Adjustment disorder with mixed anxiety and depressed mood: Secondary | ICD-10-CM | POA: Diagnosis not present

## 2023-01-23 DIAGNOSIS — L309 Dermatitis, unspecified: Secondary | ICD-10-CM | POA: Diagnosis not present

## 2023-01-27 DIAGNOSIS — W5501XD Bitten by cat, subsequent encounter: Secondary | ICD-10-CM | POA: Diagnosis not present

## 2023-01-27 DIAGNOSIS — Z789 Other specified health status: Secondary | ICD-10-CM | POA: Diagnosis not present

## 2023-01-27 DIAGNOSIS — S61452D Open bite of left hand, subsequent encounter: Secondary | ICD-10-CM | POA: Diagnosis not present

## 2023-01-27 DIAGNOSIS — S61259D Open bite of unspecified finger without damage to nail, subsequent encounter: Secondary | ICD-10-CM | POA: Diagnosis not present

## 2023-01-27 DIAGNOSIS — M24541 Contracture, right hand: Secondary | ICD-10-CM | POA: Diagnosis not present

## 2023-01-27 DIAGNOSIS — M79644 Pain in right finger(s): Secondary | ICD-10-CM | POA: Diagnosis not present

## 2023-01-27 DIAGNOSIS — L089 Local infection of the skin and subcutaneous tissue, unspecified: Secondary | ICD-10-CM | POA: Diagnosis not present

## 2023-01-27 DIAGNOSIS — Z1231 Encounter for screening mammogram for malignant neoplasm of breast: Secondary | ICD-10-CM | POA: Diagnosis not present

## 2023-01-27 DIAGNOSIS — M25641 Stiffness of right hand, not elsewhere classified: Secondary | ICD-10-CM | POA: Diagnosis not present

## 2023-01-27 DIAGNOSIS — S61256D Open bite of right little finger without damage to nail, subsequent encounter: Secondary | ICD-10-CM | POA: Diagnosis not present

## 2023-02-07 DIAGNOSIS — Z789 Other specified health status: Secondary | ICD-10-CM | POA: Diagnosis not present

## 2023-02-07 DIAGNOSIS — L089 Local infection of the skin and subcutaneous tissue, unspecified: Secondary | ICD-10-CM | POA: Diagnosis not present

## 2023-02-07 DIAGNOSIS — M79644 Pain in right finger(s): Secondary | ICD-10-CM | POA: Diagnosis not present

## 2023-02-07 DIAGNOSIS — M25641 Stiffness of right hand, not elsewhere classified: Secondary | ICD-10-CM | POA: Diagnosis not present

## 2023-02-14 DIAGNOSIS — Z789 Other specified health status: Secondary | ICD-10-CM | POA: Diagnosis not present

## 2023-02-14 DIAGNOSIS — L089 Local infection of the skin and subcutaneous tissue, unspecified: Secondary | ICD-10-CM | POA: Diagnosis not present

## 2023-02-14 DIAGNOSIS — M25641 Stiffness of right hand, not elsewhere classified: Secondary | ICD-10-CM | POA: Diagnosis not present

## 2023-02-14 DIAGNOSIS — M79644 Pain in right finger(s): Secondary | ICD-10-CM | POA: Diagnosis not present

## 2023-02-17 DIAGNOSIS — D229 Melanocytic nevi, unspecified: Secondary | ICD-10-CM | POA: Diagnosis not present

## 2023-02-17 DIAGNOSIS — L578 Other skin changes due to chronic exposure to nonionizing radiation: Secondary | ICD-10-CM | POA: Diagnosis not present

## 2023-02-17 DIAGNOSIS — L814 Other melanin hyperpigmentation: Secondary | ICD-10-CM | POA: Diagnosis not present

## 2023-02-17 DIAGNOSIS — L821 Other seborrheic keratosis: Secondary | ICD-10-CM | POA: Diagnosis not present

## 2023-02-21 DIAGNOSIS — L089 Local infection of the skin and subcutaneous tissue, unspecified: Secondary | ICD-10-CM | POA: Diagnosis not present

## 2023-02-21 DIAGNOSIS — Z789 Other specified health status: Secondary | ICD-10-CM | POA: Diagnosis not present

## 2023-02-21 DIAGNOSIS — M79644 Pain in right finger(s): Secondary | ICD-10-CM | POA: Diagnosis not present

## 2023-02-21 DIAGNOSIS — M25641 Stiffness of right hand, not elsewhere classified: Secondary | ICD-10-CM | POA: Diagnosis not present

## 2023-02-24 DIAGNOSIS — S61259D Open bite of unspecified finger without damage to nail, subsequent encounter: Secondary | ICD-10-CM | POA: Diagnosis not present

## 2023-02-24 DIAGNOSIS — M24541 Contracture, right hand: Secondary | ICD-10-CM | POA: Diagnosis not present

## 2023-02-24 DIAGNOSIS — S61452D Open bite of left hand, subsequent encounter: Secondary | ICD-10-CM | POA: Diagnosis not present

## 2023-02-24 DIAGNOSIS — R2231 Localized swelling, mass and lump, right upper limb: Secondary | ICD-10-CM | POA: Diagnosis not present

## 2023-03-09 DIAGNOSIS — R058 Other specified cough: Secondary | ICD-10-CM | POA: Diagnosis not present

## 2023-03-09 DIAGNOSIS — R5383 Other fatigue: Secondary | ICD-10-CM | POA: Diagnosis not present

## 2023-03-09 DIAGNOSIS — J411 Mucopurulent chronic bronchitis: Secondary | ICD-10-CM | POA: Diagnosis not present

## 2023-03-09 DIAGNOSIS — J029 Acute pharyngitis, unspecified: Secondary | ICD-10-CM | POA: Diagnosis not present

## 2023-03-14 DIAGNOSIS — N8301 Follicular cyst of right ovary: Secondary | ICD-10-CM | POA: Diagnosis not present

## 2023-03-14 DIAGNOSIS — R2231 Localized swelling, mass and lump, right upper limb: Secondary | ICD-10-CM | POA: Diagnosis not present

## 2023-03-14 DIAGNOSIS — S56415A Strain of extensor muscle, fascia and tendon of right ring finger at forearm level, initial encounter: Secondary | ICD-10-CM | POA: Diagnosis not present

## 2023-03-14 DIAGNOSIS — Z87891 Personal history of nicotine dependence: Secondary | ICD-10-CM | POA: Diagnosis not present

## 2023-03-14 DIAGNOSIS — M67841 Other specified disorders of synovium, right hand: Secondary | ICD-10-CM | POA: Diagnosis not present

## 2023-03-14 DIAGNOSIS — M65841 Other synovitis and tenosynovitis, right hand: Secondary | ICD-10-CM | POA: Diagnosis not present

## 2023-03-14 DIAGNOSIS — L720 Epidermal cyst: Secondary | ICD-10-CM | POA: Diagnosis not present

## 2023-03-14 DIAGNOSIS — Z8739 Personal history of other diseases of the musculoskeletal system and connective tissue: Secondary | ICD-10-CM | POA: Diagnosis not present

## 2023-03-14 DIAGNOSIS — M20021 Boutonniere deformity of right finger(s): Secondary | ICD-10-CM | POA: Diagnosis not present

## 2023-03-24 DIAGNOSIS — Z789 Other specified health status: Secondary | ICD-10-CM | POA: Diagnosis not present

## 2023-03-24 DIAGNOSIS — M25641 Stiffness of right hand, not elsewhere classified: Secondary | ICD-10-CM | POA: Diagnosis not present

## 2023-03-24 DIAGNOSIS — R609 Edema, unspecified: Secondary | ICD-10-CM | POA: Diagnosis not present

## 2023-03-24 DIAGNOSIS — Z4889 Encounter for other specified surgical aftercare: Secondary | ICD-10-CM | POA: Diagnosis not present

## 2023-03-31 DIAGNOSIS — R2231 Localized swelling, mass and lump, right upper limb: Secondary | ICD-10-CM | POA: Diagnosis not present

## 2023-03-31 DIAGNOSIS — L089 Local infection of the skin and subcutaneous tissue, unspecified: Secondary | ICD-10-CM | POA: Diagnosis not present

## 2023-05-05 DIAGNOSIS — M24541 Contracture, right hand: Secondary | ICD-10-CM | POA: Diagnosis not present

## 2023-05-05 DIAGNOSIS — R2231 Localized swelling, mass and lump, right upper limb: Secondary | ICD-10-CM | POA: Diagnosis not present

## 2023-05-12 DIAGNOSIS — Z789 Other specified health status: Secondary | ICD-10-CM | POA: Diagnosis not present

## 2023-05-12 DIAGNOSIS — M25641 Stiffness of right hand, not elsewhere classified: Secondary | ICD-10-CM | POA: Diagnosis not present

## 2023-05-26 DIAGNOSIS — Z789 Other specified health status: Secondary | ICD-10-CM | POA: Diagnosis not present

## 2023-05-26 DIAGNOSIS — M25641 Stiffness of right hand, not elsewhere classified: Secondary | ICD-10-CM | POA: Diagnosis not present

## 2023-05-26 DIAGNOSIS — Z4889 Encounter for other specified surgical aftercare: Secondary | ICD-10-CM | POA: Diagnosis not present

## 2023-05-26 DIAGNOSIS — M79644 Pain in right finger(s): Secondary | ICD-10-CM | POA: Diagnosis not present

## 2023-06-12 DIAGNOSIS — M20021 Boutonniere deformity of right finger(s): Secondary | ICD-10-CM | POA: Diagnosis not present

## 2023-06-12 DIAGNOSIS — M24541 Contracture, right hand: Secondary | ICD-10-CM | POA: Diagnosis not present

## 2023-06-12 DIAGNOSIS — S61452D Open bite of left hand, subsequent encounter: Secondary | ICD-10-CM | POA: Diagnosis not present

## 2023-06-12 DIAGNOSIS — S61259D Open bite of unspecified finger without damage to nail, subsequent encounter: Secondary | ICD-10-CM | POA: Diagnosis not present

## 2023-08-18 IMAGING — CR DG FEMUR 2+V*R*
4 series · 4 of 4 positions shown · non-contrast
Comparison: None.

CLINICAL DATA: Low back pain with left-sided sciatica.

EXAM:
PELVIS - 1-2 VIEW; LEFT FEMUR 2 VIEWS; RIGHT FEMUR 2 VIEWS; LUMBAR
SPINE - 2-3 VIEW

[t femur with hip  ap right]
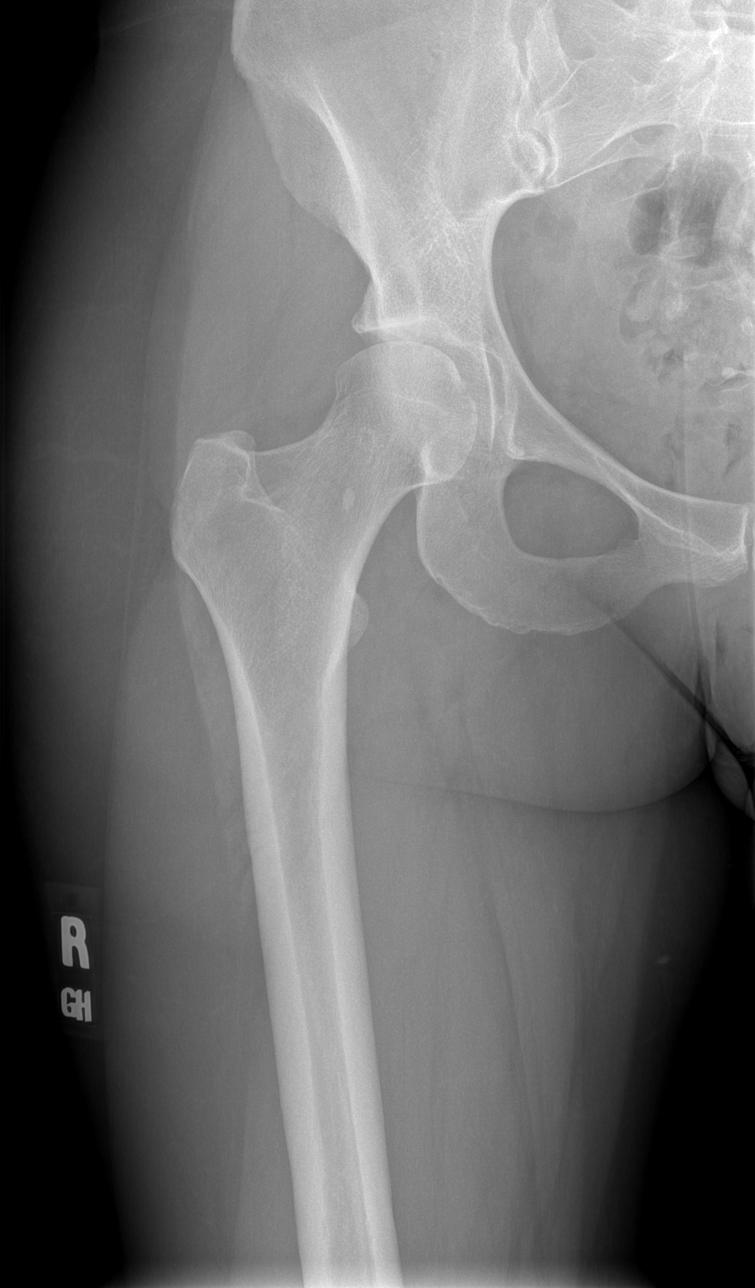

[t femur with knee ap right]
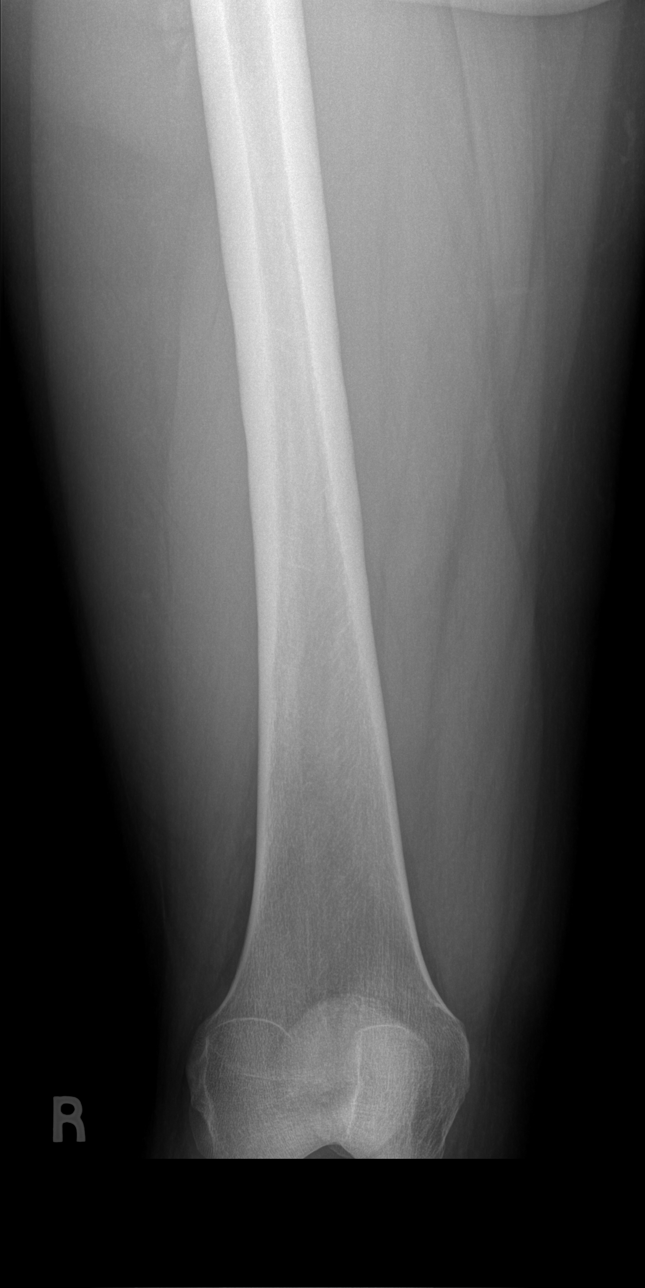

[t femur with hip lat right]
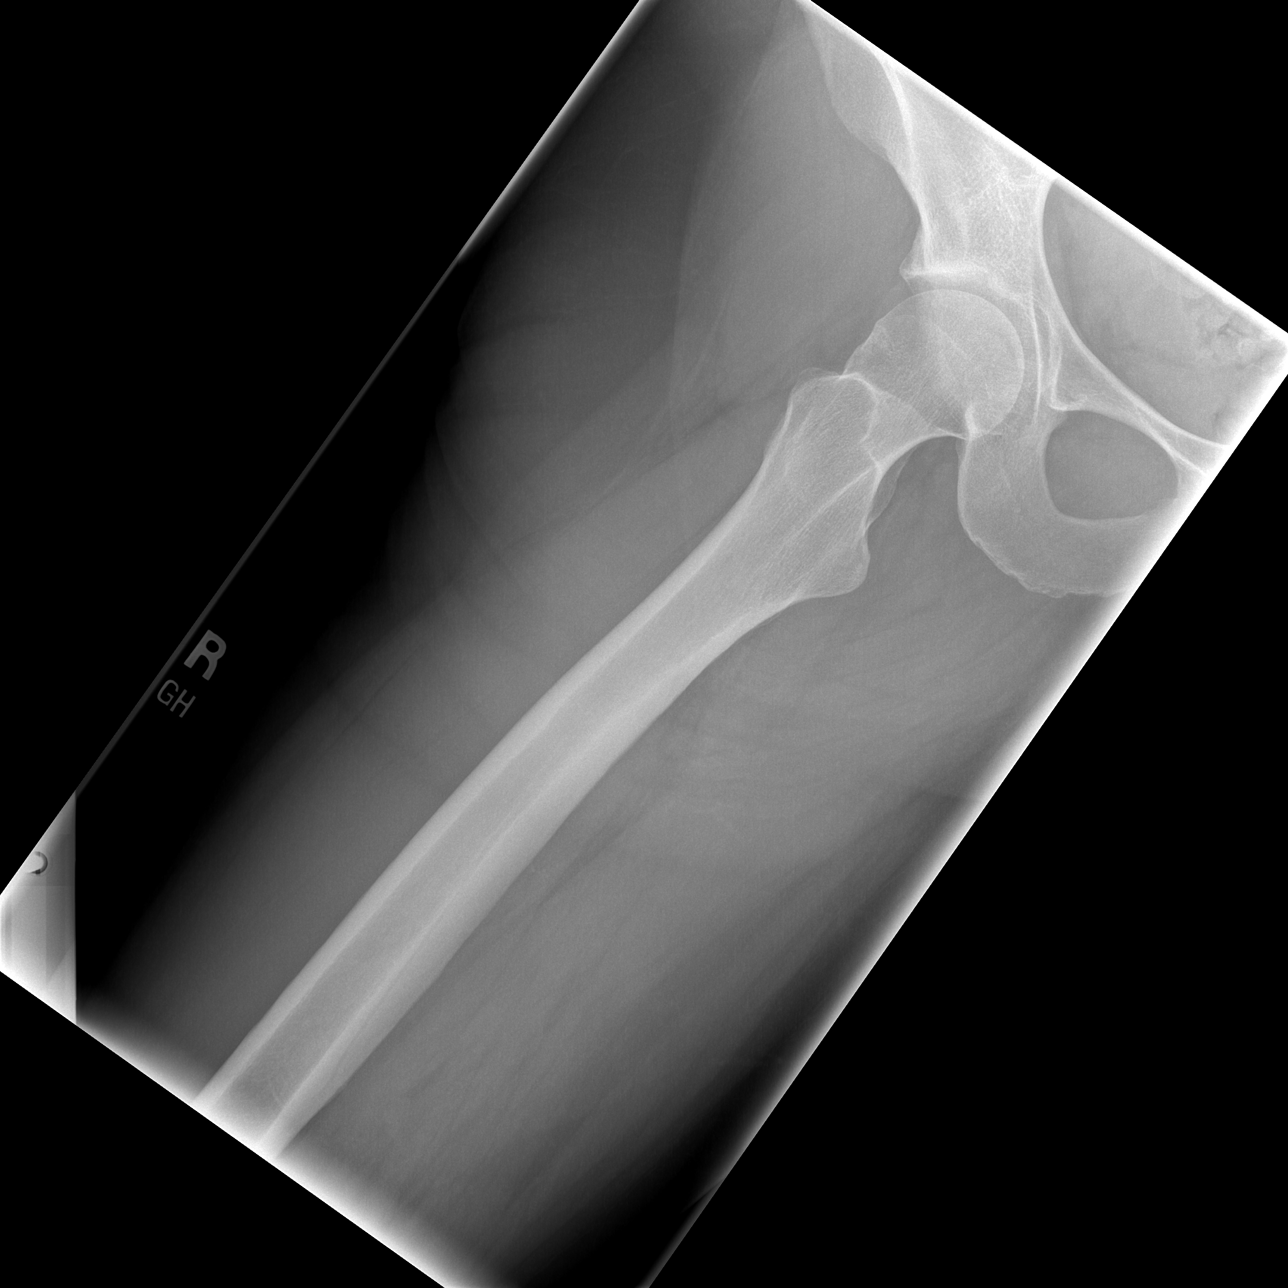

[t femur with knee lat right]
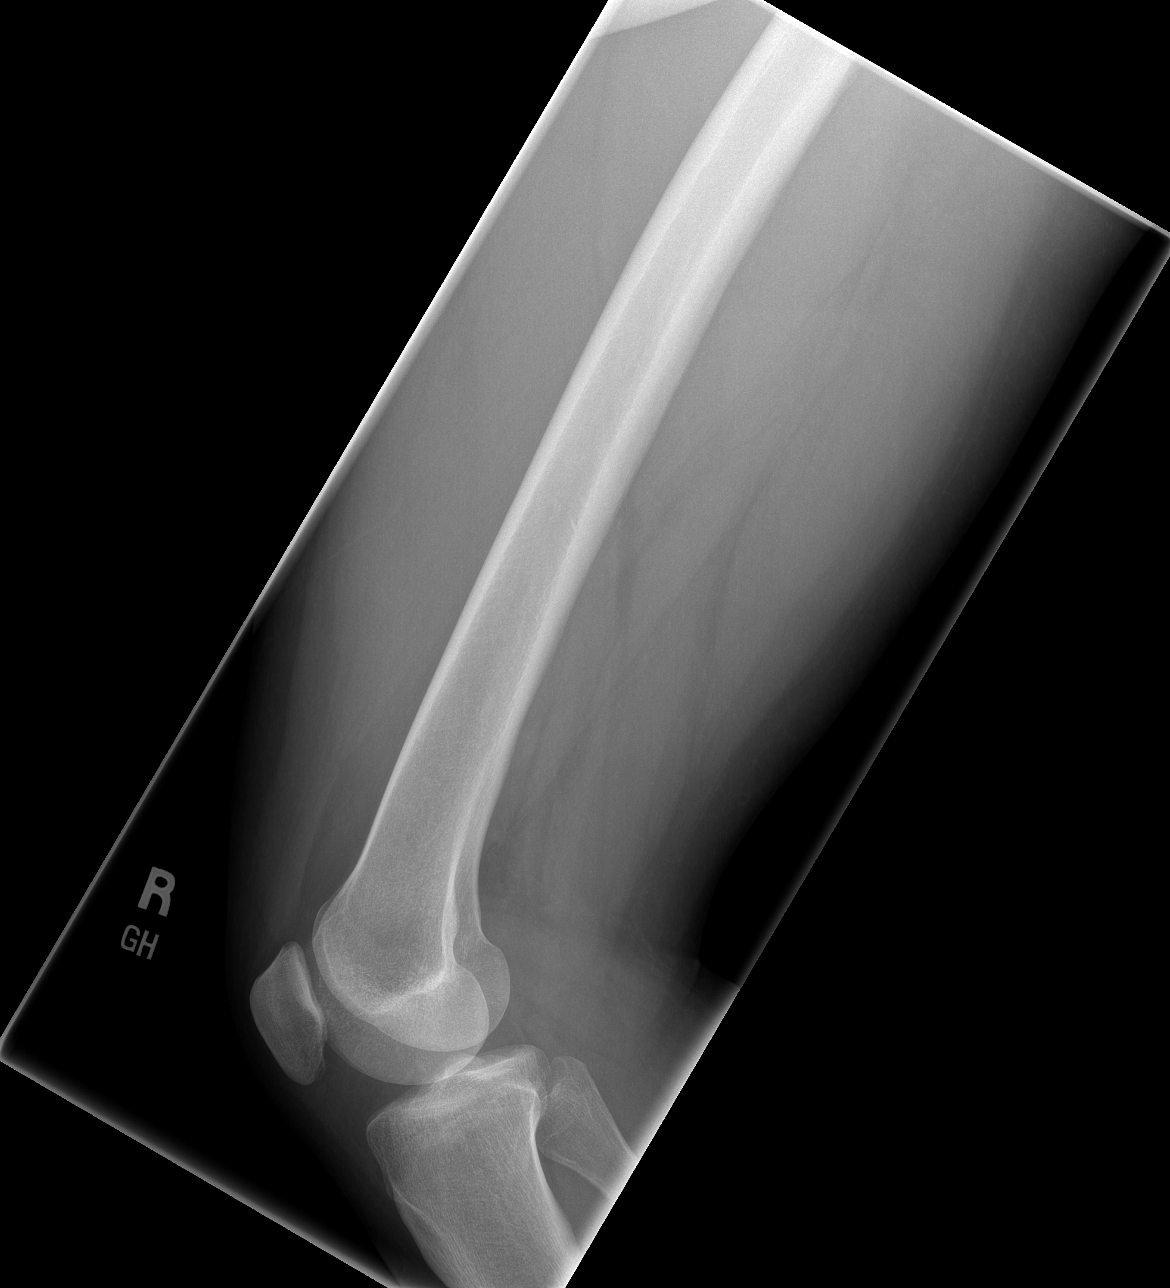

[4 of 4 positions shown; findings below may reference images not displayed]

FINDINGS: Pelvis:

Bony mineralization is within normal limits. There is no evidence
for fracture. Alignment is anatomic. Joint spaces are maintained. An
IUD is noted in the uterus.

Right femur:

There is no evidence for fracture or dislocation. Joint spaces are
well maintained. Soft tissues are within normal limits. There is a
small sclerotic density measuring 6 mm in the femoral neck, likely a
bone island.

Left femur:

There is no fracture or dislocation. Joint spaces are well
maintained. Soft tissues are within normal limits of

Lumbar spine:

There is no evidence for fracture. Alignment is anatomic. Disc
spaces are well maintained. There are surgical clips in the right
abdomen.
IMPRESSION: 1. No acute bony abnormality of the pelvis, lumbar spine, right
femur or left femur.
2. No significant degenerative change.
